# Patient Record
Sex: Female | Born: 2000 | Race: Black or African American | Hispanic: No | Smoking: Never smoker
Health system: Southern US, Community
[De-identification: ages and names within clinical notes are randomized; demographics above are authoritative.]

## PROBLEM LIST (undated history)

## (undated) DIAGNOSIS — M419 Scoliosis, unspecified: Secondary | ICD-10-CM

## (undated) DIAGNOSIS — N390 Urinary tract infection, site not specified: Secondary | ICD-10-CM

## (undated) DIAGNOSIS — F419 Anxiety disorder, unspecified: Secondary | ICD-10-CM

## (undated) DIAGNOSIS — F32A Depression, unspecified: Secondary | ICD-10-CM

## (undated) DIAGNOSIS — J45909 Unspecified asthma, uncomplicated: Secondary | ICD-10-CM

## (undated) HISTORY — PX: NO PAST SURGERIES: SHX2092

---
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
## 2020-02-02 ENCOUNTER — Ambulatory Visit: Payer: Medicaid Other | Admitting: Student

## 2020-08-23 DIAGNOSIS — E282 Polycystic ovarian syndrome: Secondary | ICD-10-CM | POA: Insufficient documentation

## 2021-02-24 ENCOUNTER — Encounter (HOSPITAL_BASED_OUTPATIENT_CLINIC_OR_DEPARTMENT_OTHER): Payer: Self-pay | Admitting: Emergency Medicine

## 2021-02-24 ENCOUNTER — Other Ambulatory Visit: Payer: Self-pay

## 2021-02-24 ENCOUNTER — Emergency Department (HOSPITAL_BASED_OUTPATIENT_CLINIC_OR_DEPARTMENT_OTHER)
Admission: EM | Admit: 2021-02-24 | Discharge: 2021-02-24 | Discharge status: Against Medical Advice | Payer: Medicaid Other | Attending: Physician Assistant | Admitting: Physician Assistant

## 2021-02-24 DIAGNOSIS — R509 Fever, unspecified: Secondary | ICD-10-CM | POA: Diagnosis not present

## 2021-02-24 DIAGNOSIS — Z20822 Contact with and (suspected) exposure to covid-19: Secondary | ICD-10-CM | POA: Diagnosis not present

## 2021-02-24 DIAGNOSIS — M7918 Myalgia, other site: Secondary | ICD-10-CM | POA: Insufficient documentation

## 2021-02-24 DIAGNOSIS — J101 Influenza due to other identified influenza virus with other respiratory manifestations: Secondary | ICD-10-CM | POA: Insufficient documentation

## 2021-02-24 DIAGNOSIS — R059 Cough, unspecified: Secondary | ICD-10-CM | POA: Insufficient documentation

## 2021-02-24 DIAGNOSIS — Z5321 Procedure and treatment not carried out due to patient leaving prior to being seen by health care provider: Secondary | ICD-10-CM | POA: Diagnosis not present

## 2021-02-24 LAB — RESP PANEL BY RT-PCR (FLU A&B, COVID) ARPGX2
Influenza A by PCR: POSITIVE — AB
Influenza B by PCR: NEGATIVE
SARS Coronavirus 2 by RT PCR: NEGATIVE

## 2021-02-24 MED ORDER — ACETAMINOPHEN 325 MG PO TABS: 650.0000 mg | ORAL_TABLET | Freq: Once | ORAL | Status: AC

## 2021-02-24 MED ORDER — ACETAMINOPHEN 325 MG PO TABS
ORAL_TABLET | ORAL | Status: AC
  Administered 2021-02-24: 650 mg via ORAL
  Filled 2021-02-24: qty 1

## 2021-02-24 NOTE — ED Triage Notes (Signed)
Pt presents to ED POV. Pt c/o fever, generalized body aches, and cough since Monday.

## 2021-03-31 ENCOUNTER — Telehealth (HOSPITAL_BASED_OUTPATIENT_CLINIC_OR_DEPARTMENT_OTHER): Payer: Self-pay | Admitting: Medical

## 2021-03-31 NOTE — Telephone Encounter (Signed)
Called patient and left message to call the office to setup her gyn visit with Raynelle Fanning.

## 2021-05-29 ENCOUNTER — Encounter (HOSPITAL_BASED_OUTPATIENT_CLINIC_OR_DEPARTMENT_OTHER): Payer: Self-pay | Admitting: Emergency Medicine

## 2021-05-29 ENCOUNTER — Other Ambulatory Visit: Payer: Self-pay

## 2021-05-29 ENCOUNTER — Emergency Department (HOSPITAL_BASED_OUTPATIENT_CLINIC_OR_DEPARTMENT_OTHER)
Admission: EM | Admit: 2021-05-29 | Discharge: 2021-05-29 | Disposition: A | Discharge status: Home / Self Care | Payer: Medicaid Other | Attending: Emergency Medicine | Admitting: Emergency Medicine

## 2021-05-29 ENCOUNTER — Other Ambulatory Visit (HOSPITAL_BASED_OUTPATIENT_CLINIC_OR_DEPARTMENT_OTHER): Payer: Self-pay

## 2021-05-29 DIAGNOSIS — J029 Acute pharyngitis, unspecified: Secondary | ICD-10-CM | POA: Diagnosis present

## 2021-05-29 DIAGNOSIS — J020 Streptococcal pharyngitis: Secondary | ICD-10-CM | POA: Insufficient documentation

## 2021-05-29 DIAGNOSIS — Z20822 Contact with and (suspected) exposure to covid-19: Secondary | ICD-10-CM | POA: Insufficient documentation

## 2021-05-29 LAB — RESP PANEL BY RT-PCR (FLU A&B, COVID) ARPGX2
Influenza A by PCR: NEGATIVE
Influenza B by PCR: NEGATIVE
SARS Coronavirus 2 by RT PCR: NEGATIVE

## 2021-05-29 LAB — PREGNANCY, URINE: Preg Test, Ur: NEGATIVE

## 2021-05-29 LAB — GROUP A STREP BY PCR: Group A Strep by PCR: DETECTED — AB

## 2021-05-29 MED ORDER — LIDOCAINE VISCOUS HCL 2 % MT SOLN
15.0000 mL | Freq: Three times a day (TID) | OROMUCOSAL | 0 refills | Status: DC | PRN
  Filled 2021-05-29: qty 150, 4d supply, fill #0

## 2021-05-29 MED ORDER — LIDOCAINE VISCOUS HCL 2 % MT SOLN
15.0000 mL | Freq: Once | OROMUCOSAL | Status: AC
  Administered 2021-05-29: 15 mL via OROMUCOSAL
  Filled 2021-05-29: qty 15

## 2021-05-29 MED ORDER — NAPROXEN 500 MG PO TABS
500.0000 mg | ORAL_TABLET | Freq: Two times a day (BID) | ORAL | 0 refills | Status: DC
  Filled 2021-05-29: qty 30, 15d supply, fill #0

## 2021-05-29 MED ORDER — AMOXICILLIN 500 MG PO CAPS
1000.0000 mg | ORAL_CAPSULE | Freq: Once | ORAL | Status: AC
  Administered 2021-05-29: 1000 mg via ORAL
  Filled 2021-05-29: qty 2

## 2021-05-29 MED ORDER — DEXAMETHASONE 4 MG PO TABS
6.0000 mg | ORAL_TABLET | Freq: Once | ORAL | Status: AC
  Administered 2021-05-29: 6 mg via ORAL
  Filled 2021-05-29: qty 2

## 2021-05-29 MED ORDER — KETOROLAC TROMETHAMINE 30 MG/ML IJ SOLN
30.0000 mg | Freq: Once | INTRAMUSCULAR | Status: AC
  Administered 2021-05-29: 30 mg via INTRAMUSCULAR
  Filled 2021-05-29: qty 1

## 2021-05-29 MED ORDER — AMOXICILLIN 500 MG PO CAPS
1000.0000 mg | ORAL_CAPSULE | Freq: Every day | ORAL | 0 refills | Status: AC
  Filled 2021-05-29: qty 18, 9d supply, fill #0

## 2021-05-29 NOTE — ED Provider Notes (Signed)
[facility] EMERGENCY DEPT Provider Note   CSN: [id number] Arrival date & time: 05/29/21  1243     History  Chief Complaint  Patient presents with   Sore Throat    Lauren Wilkinson is a 21 y.o. female with no significant past medical history here for evaluation of sore throat.  Began yesterday.  Felt warm this morning, thought she had a fever or did not take her temperature.  Has been taking Tylenol and ibuprofen without relief.  Pain hurts worse with swallowing.  Has had some generalized myalgias.  No headache, neck pain, change in voice, chest pain, shortness of breath, congestion, rhinorrhea, cough abdominal pain.  No known COVID exposures.  No diarrhea, dysuria.  Denies chance of pregnancy.  HPI     Home Medications Prior to Admission medications   Medication Sig Start Date End Date Taking? Authorizing Provider  amoxicillin (AMOXIL) 500 MG capsule Take 2 capsules (1,000 mg total) by mouth daily for 9 days. 05/29/21 06/07/21 Yes Monette Omara A, PA-C  lidocaine (XYLOCAINE) 2 % solution Use as directed 15 mLs in the mouth or throat every 8 (eight) hours as needed for mouth pain. 05/29/21  Yes Treniece Holsclaw A, PA-C  naproxen (NAPROSYN) 500 MG tablet Take 1 tablet (500 mg total) by mouth 2 (two) times daily. 05/29/21  Yes Hazeline Charnley A, PA-C  Cetirizine HCl (ZYRTEC PO) Take by mouth.    [provider]  etonogestrel-ethinyl estradiol (NUVARING) 0.12-0.015 MG/24HR vaginal ring Insert vaginally and leave in place for 3 consecutive weeks, then remove for 1 week. 03/18/19   Luvenia Redden, PA-C  FLUoxetine (PROZAC) 20 MG capsule Take 1 capsule (20 mg total) by mouth daily. 10/02/19 12/31/19  Deloria Lair, NP  fluticasone (FLONASE) 50 MCG/ACT nasal spray Place 2 sprays into both nostrils daily. 12/24/16   Waynetta Pean, PA-C  ibuprofen (ADVIL,MOTRIN) 400 MG tablet Take 1 tablet (400 mg total) by mouth every 6 (six) hours as needed for moderate pain. 12/24/16    Waynetta Pean, PA-C  Montelukast Sodium (SINGULAIR PO) Take by mouth.    [provider]  norelgestromin-ethinyl estradiol (ORTHO EVRA) 150-35 MCG/24HR transdermal patch Place 1 patch onto the skin once a week. Patient not taking: Reported on 03/18/2019 02/02/19   Nugent, Gerrie Nordmann, NP  SYEDA 3-0.03 MG tablet TAKE 1 TABLET BY MOUTH DAILY Patient not taking: Reported on 03/18/2019 06/18/18   Glenice Bow, DO      Allergies    Patient has no known allergies.    Review of Systems   Review of Systems  Constitutional:  Positive for chills, fatigue and fever.  HENT:  Positive for sore throat. Negative for congestion, drooling, postnasal drip, rhinorrhea, sinus pressure, sinus pain, sneezing, trouble swallowing and voice change.   Respiratory: Negative.    Cardiovascular: Negative.   Gastrointestinal: Negative.   Genitourinary: Negative.   Musculoskeletal: Negative.   Skin: Negative.   Neurological: Negative.   All other systems reviewed and are negative.  Physical Exam Updated Vital Signs BP 115/78 (BP Location: Right Arm)    Pulse (!) 116    Temp 98.6 F (37 C) (Oral)    Resp 18    Ht 5\' 4"  (1.626 m)    Wt 81.6 kg    SpO2 100%    BMI 30.90 kg/m  Physical Exam Vitals and nursing note reviewed.  Constitutional:      General: She is not in acute distress.    Appearance: She is well-developed. She  is not ill-appearing, toxic-appearing or diaphoretic.  HENT:     Head: Normocephalic and atraumatic.     Jaw: There is normal jaw occlusion.     Comments: No facial swelling.  No drooling, dysphagia or trismus    Nose: Nose normal. No congestion or rhinorrhea.     Right Sinus: No maxillary sinus tenderness or frontal sinus tenderness.     Left Sinus: No maxillary sinus tenderness or frontal sinus tenderness.     Mouth/Throat:     Lips: Pink.     Mouth: Mucous membranes are moist.     Palate: No mass and lesions.     Pharynx: Uvula midline. Posterior oropharyngeal erythema  present. No oropharyngeal exudate or uvula swelling.     Tonsils: Tonsillar exudate present. No tonsillar abscesses. 3+ on the right. 3+ on the left.     Comments: Posterior oropharynx erythematous.  Tonsils 3+ bilaterally with erythema and exudate.  Uvula midline.  No drooling, sublingual area soft.  No pooling of secretions.  No evidence of PTA or RPA Eyes:     Pupils: Pupils are equal, round, and reactive to light.  Neck:     Trachea: Trachea and phonation normal.     Comments: No neck stiffness or neck rigidity Cardiovascular:     Rate and Rhythm: Normal rate.  Pulmonary:     Effort: No respiratory distress.  Abdominal:     General: There is no distension.  Musculoskeletal:        General: Normal range of motion.     Cervical back: Full passive range of motion without pain and normal range of motion.  Skin:    General: Skin is warm and dry.  Neurological:     General: No focal deficit present.     Mental Status: She is alert.  Psychiatric:        Mood and Affect: Mood normal.    ED Results / Procedures / Treatments   Labs (all labs ordered are listed, but only abnormal results are displayed) Labs Reviewed  GROUP A STREP BY PCR - Abnormal; Notable for the following components:      Result Value   Group A Strep by PCR DETECTED (*)    All other components within normal limits  RESP PANEL BY RT-PCR (FLU A&B, COVID) ARPGX2  PREGNANCY, URINE    EKG None  Radiology No results found.  Procedures Procedures    Medications Ordered in ED Medications  ketorolac (TORADOL) 30 MG/ML injection 30 mg (30 mg Intramuscular Given 05/29/21 1334)  dexamethasone (DECADRON) tablet 6 mg (6 mg Oral Given 05/29/21 1337)  lidocaine (XYLOCAINE) 2 % viscous mouth solution 15 mL (15 mLs Mouth/Throat Given 05/29/21 1338)  amoxicillin (AMOXIL) capsule 1,000 mg (1,000 mg Oral Given 05/29/21 1356)    ED Course/ Medical Decision Making/ A&P    21 year old here for evaluation of sore throat and  subjective fever which began yesterday.  Associated myalgias.  She is afebrile, nonseptic, not ill-appearing.  Her heart and lungs are clear.  Abdomen is soft, nontender.  She has no neck stiffness or neck rigidity.  She has no meningismus.  Does have posterior oropharyngeal erythema.  Tonsils 2/3+ bilaterally with erythema and exudate.  Do not touch at midline.  Uvula is midline without any deviation.  She has no evidence of PTA or RPA.  She has no change in voice.  She is tolerating her secretions as well as p.o. intake.  No pooling of secretions.  Labs personally viewed  and interpreted: Preg neg Strep positive, given amox COVID/Flu neg  Patient reassessed. Given abx, pain control here in ED.  She appears otherwise well, tolerating p.o. intake.  Discussed laboratory findings.  DC home with Amoxacillin.  She will return for new or worsening symptoms.  The patient has been appropriately medically screened and/or stabilized in the ED. I have low suspicion for any other emergent medical condition which would require further screening, evaluation or treatment in the ED or require inpatient management.  Patient is hemodynamically stable and in no acute distress.  Patient able to ambulate in department prior to ED.  Evaluation does not show acute pathology that would require ongoing or additional emergent interventions while in the emergency department or further inpatient treatment.  I have discussed the diagnosis with the patient and answered all questions.  Pain is been managed while in the emergency department and patient has no further complaints prior to discharge.  Patient is comfortable with plan discussed in room and is stable for discharge at this time.  I have discussed strict return precautions for returning to the emergency department.  Patient was encouraged to follow-up with PCP/specialist refer to at discharge.                            Medical Decision Making Amount and/or Complexity of  Data Reviewed External Data Reviewed: labs and notes. Labs: ordered. Decision-making details documented in ED Course.  Risk OTC drugs. Prescription drug management. Risk Details: Do not feel patient needs additional labs, imaging, hospitalization at this time          Final Clinical Impression(s) / ED Diagnoses Final diagnoses:  Strep pharyngitis    Rx / DC Orders ED Discharge Orders          Ordered    lidocaine (XYLOCAINE) 2 % solution  Every 8 hours PRN        05/29/21 1419    amoxicillin (AMOXIL) 500 MG capsule  Daily        05/29/21 1419    naproxen (NAPROSYN) 500 MG tablet  2 times daily        05/29/21 1419              Lorah Kalina A, PA-C 05/29/21 [place], [place], DO 05/30/21 [phone]

## 2021-05-29 NOTE — Discharge Instructions (Signed)
Your testing today showed that you have strep pharyngitis which is likely the cause of your sore throat.  We have written you for antibiotics.  We have also written you for something to help with your pain.  You may take additional Tylenol with this.  You need to be out of work for 24 hours without fever without using Tylenol or ibuprofen.

## 2021-05-29 NOTE — ED Triage Notes (Signed)
Pt arrives to ED with c/o sore throat, body aches. This started last night. This morning she woke up with hot/warm.

## 2021-07-09 ENCOUNTER — Encounter (HOSPITAL_BASED_OUTPATIENT_CLINIC_OR_DEPARTMENT_OTHER): Payer: Self-pay

## 2021-07-09 ENCOUNTER — Emergency Department (HOSPITAL_BASED_OUTPATIENT_CLINIC_OR_DEPARTMENT_OTHER)
Admission: EM | Admit: 2021-07-09 | Discharge: 2021-07-09 | Disposition: A | Discharge status: Home / Self Care | Payer: Medicaid Other | Attending: Emergency Medicine | Admitting: Emergency Medicine

## 2021-07-09 ENCOUNTER — Other Ambulatory Visit: Payer: Self-pay

## 2021-07-09 DIAGNOSIS — Z7951 Long term (current) use of inhaled steroids: Secondary | ICD-10-CM | POA: Diagnosis not present

## 2021-07-09 DIAGNOSIS — J45909 Unspecified asthma, uncomplicated: Secondary | ICD-10-CM | POA: Diagnosis not present

## 2021-07-09 DIAGNOSIS — H1032 Unspecified acute conjunctivitis, left eye: Secondary | ICD-10-CM

## 2021-07-09 DIAGNOSIS — H1089 Other conjunctivitis: Secondary | ICD-10-CM | POA: Insufficient documentation

## 2021-07-09 DIAGNOSIS — H109 Unspecified conjunctivitis: Secondary | ICD-10-CM | POA: Diagnosis present

## 2021-07-09 MED ORDER — CIPROFLOXACIN HCL 0.3 % OP SOLN: 2.0000 [drp] | OPHTHALMIC | 0 refills | Status: DC

## 2021-07-09 NOTE — ED Triage Notes (Signed)
Left eye, red & itchy started yesterday ?

## 2021-07-09 NOTE — ED Provider Notes (Signed)
?[facility] EMERGENCY DEPT ?Provider Note ? ? ?CSN: [id number] ?Arrival date & time: 07/09/21  1950 ? ?  ? ?History ? ?Chief Complaint  ?Patient presents with  ? Conjunctivitis  ? ? ?Lauren Wilkinson is a 21 y.o. female with medical history of asthma and scoliosis.  Patient presents ED for evaluation of left-sided eye swelling and redness.  Patient states that 4 days ago her left eye began swelling and she noticed that she had crusting and discharge from it in the morning.  Patient reports that her brother recently had eye infection and she is concerned about this.  Patient states that over the last 4 days the symptoms of progressively worsened.  She has not tried any OTC medication.  Patient endorsing red eye, itching to the eye, discharge from eye.  Patient denies any fevers, nausea, vomiting, body aches or chills, sore throat, abdominal pain, diarrhea, visual disturbance.  Patient does wear contact lenses. ? ? ?Conjunctivitis ?Pertinent negatives include no abdominal pain.  ? ?  ? ?Home Medications ?Prior to Admission medications   ?Medication Sig Start Date End Date Taking? Authorizing Provider  ?ciprofloxacin (CILOXAN) 0.3 % ophthalmic solution Place 2 drops into the left eye every 4 (four) hours while awake. Administer 1 drop, every 2 hours, while awake, for 2 days. Then 1 drop, every 4 hours, while awake, for the next 5 days. 07/09/21  Yes Al Decant, PA-C  ?Cetirizine HCl (ZYRTEC PO) Take by mouth.    [provider]  ?etonogestrel-ethinyl estradiol (NUVARING) 0.12-0.015 MG/24HR vaginal ring Insert vaginally and leave in place for 3 consecutive weeks, then remove for 1 week. 03/18/19   Marny Lowenstein, PA-C  ?FLUoxetine (PROZAC) 20 MG capsule Take 1 capsule (20 mg total) by mouth daily. 10/02/19 12/31/19  Jearld Lesch, NP  ?fluticasone (FLONASE) 50 MCG/ACT nasal spray Place 2 sprays into both nostrils daily. 12/24/16   Everlene Farrier, PA-C  ?ibuprofen (ADVIL,MOTRIN) 400 MG tablet  Take 1 tablet (400 mg total) by mouth every 6 (six) hours as needed for moderate pain. 12/24/16   Everlene Farrier, PA-C  ?lidocaine (XYLOCAINE) 2 % solution Use as directed 15 mLs in the mouth or throat every 8 (eight) hours as needed for mouth pain. 05/29/21   Henderly, Britni A, PA-C  ?Montelukast Sodium (SINGULAIR PO) Take by mouth.    [provider]  ?naproxen (NAPROSYN) 500 MG tablet Take 1 tablet (500 mg total) by mouth 2 (two) times daily. 05/29/21   Henderly, Britni A, PA-C  ?norelgestromin-ethinyl estradiol (ORTHO EVRA) 150-35 MCG/24HR transdermal patch Place 1 patch onto the skin once a week. ?Patient not taking: Reported on 03/18/2019 02/02/19   Nugent, Odie Sera, NP  ?SYEDA 3-0.03 MG tablet TAKE 1 TABLET BY MOUTH DAILY ?Patient not taking: Reported on 03/18/2019 06/18/18   Tamera Stands, DO  ?   ? ?Allergies    ?Patient has no known allergies.   ? ?Review of Systems   ?Review of Systems  ?Constitutional:  Negative for chills and fever.  ?Eyes:  Positive for discharge, redness and itching. Negative for visual disturbance.  ?Gastrointestinal:  Negative for abdominal pain, diarrhea, nausea and vomiting.  ?All other systems reviewed and are negative. ? ?Physical Exam ?Updated Vital Signs ?BP (!) 151/90 (BP Location: Right Arm)   Pulse (!) 110   Temp 98.3 ?F (36.8 ?C) (Oral)   Resp 18   Ht 5\' 5"  (1.651 m)   Wt 79.8 kg   LMP 07/06/2021 (Exact Date)  SpO2 100%   BMI 29.29 kg/m?  ?Physical Exam ?Vitals and nursing note reviewed.  ?Constitutional:   ?   General: She is not in acute distress. ?   Appearance: Normal appearance. She is not ill-appearing, toxic-appearing or diaphoretic.  ?HENT:  ?   Head: Normocephalic and atraumatic.  ?   Nose: Nose normal. No congestion.  ?Eyes:  ?   General: Lids are everted, no foreign bodies appreciated. Vision grossly intact. Gaze aligned appropriately. No visual field deficit.    ?   Left eye: Discharge present.No foreign body.  ?   Extraocular Movements:  Extraocular movements intact.  ?   Conjunctiva/sclera:  ?   Left eye: Left conjunctiva is injected.  ?   Pupils: Pupils are equal, round, and reactive to light.  ?Cardiovascular:  ?   Rate and Rhythm: Normal rate and regular rhythm.  ?Pulmonary:  ?   Effort: Pulmonary effort is normal.  ?   Breath sounds: Normal breath sounds. No wheezing.  ?Abdominal:  ?   General: Abdomen is flat. Bowel sounds are normal.  ?   Palpations: Abdomen is soft.  ?   Tenderness: There is no abdominal tenderness.  ?Musculoskeletal:  ?   Cervical back: Normal range of motion and neck supple. No tenderness.  ?Skin: ?   General: Skin is warm.  ?   Capillary Refill: Capillary refill takes less than 2 seconds.  ?Neurological:  ?   Mental Status: She is alert and oriented to person, place, and time.  ? ? ?ED Results / Procedures / Treatments   ?Labs ?(all labs ordered are listed, but only abnormal results are displayed) ?Labs Reviewed - No data to display ? ?EKG ?None ? ?Radiology ?No results found. ? ?Procedures ?Procedures  ? ?Medications Ordered in ED ?Medications - No data to display ? ?ED Course/ Medical Decision Making/ A&P ?  ?                        ?Medical Decision Making ? ?21 year old female, contact lens wearer, presents ED for evaluation of left eye swelling and redness.  Please see HPI for further details. ? ?On exam, patient afebrile, nontachycardic, nonhypoxic.  Patient lung sounds clear bilaterally.  Patient left eye is injected with scant discharge and canthus.  Patient visual acuity intact.  Patient denies any visual disturbances.  There is no soft tissue swelling around the eye to suggest periorbital cellulitis.  Patient does not endorse any pain with assessment of EOMs.  Orbital cellulitis suspicion is low at this time.  Patient states that she does have positive sick contact, her brother recently had conjunctivitis. ? ?At this time, patient stable for discharge.  Provided patient with antibiotic eyedrops to cover  Pseudomonas due to her contact lens usage.  Patient provided with return precautions and she voiced understanding.  Patient had all of her questions answered to her satisfaction.  Patient stable for discharge at this time. ? ? ?Final Clinical Impression(s) / ED Diagnoses ?Final diagnoses:  ?Acute bacterial conjunctivitis of left eye  ? ? ?Rx / DC Orders ?ED Discharge Orders   ? ?      Ordered  ?  ciprofloxacin (CILOXAN) 0.3 % ophthalmic solution  Every 4 hours while awake       ? 07/09/21 2108  ? ?  ?  ? ?  ? ? ?  ?Azucena Cecil, PA-C ?07/09/21 2109 ? ?  ?Malvin Johns, MD ?07/09/21 2312 ? ?

## 2021-07-09 NOTE — Discharge Instructions (Signed)
Return to ED with any new symptoms such as worsened eye redness, itching, fevers, nausea or vomiting, visual disturbances ?Please pick up your antibiotic eyedrops I sent in ?Please do not wear contact lenses until the infection clears ?You may also use warm compresses to the eye to alleviate any discomfort ?Follow back up with your PCP for any ongoing needs ?

## 2021-09-13 ENCOUNTER — Encounter (HOSPITAL_BASED_OUTPATIENT_CLINIC_OR_DEPARTMENT_OTHER): Payer: Self-pay | Admitting: Emergency Medicine

## 2021-09-13 ENCOUNTER — Emergency Department (HOSPITAL_BASED_OUTPATIENT_CLINIC_OR_DEPARTMENT_OTHER): Payer: Medicaid Other

## 2021-09-13 ENCOUNTER — Other Ambulatory Visit: Payer: Self-pay

## 2021-09-13 ENCOUNTER — Inpatient Hospital Stay (HOSPITAL_BASED_OUTPATIENT_CLINIC_OR_DEPARTMENT_OTHER)
Admission: EM | Admit: 2021-09-13 | Discharge: 2021-09-14 | Disposition: A | Discharge status: Home / Self Care | Payer: Medicaid Other | Attending: Obstetrics & Gynecology | Admitting: Obstetrics & Gynecology

## 2021-09-13 DIAGNOSIS — Z6790 Unspecified blood type, Rh positive: Secondary | ICD-10-CM

## 2021-09-13 DIAGNOSIS — Z79899 Other long term (current) drug therapy: Secondary | ICD-10-CM | POA: Insufficient documentation

## 2021-09-13 DIAGNOSIS — O00102 Left tubal pregnancy without intrauterine pregnancy: Secondary | ICD-10-CM | POA: Diagnosis present

## 2021-09-13 LAB — COMPREHENSIVE METABOLIC PANEL
ALT: 10 U/L (ref 0–44)
AST: 16 U/L (ref 15–41)
Albumin: 4.2 g/dL (ref 3.5–5.0)
Alkaline Phosphatase: 59 U/L (ref 38–126)
Anion gap: 12 (ref 5–15)
BUN: 8 mg/dL (ref 6–20)
CO2: 22 mmol/L (ref 22–32)
Calcium: 9.2 mg/dL (ref 8.9–10.3)
Chloride: 106 mmol/L (ref 98–111)
Creatinine, Ser: 0.73 mg/dL (ref 0.44–1.00)
GFR, Estimated: >60 mL/min (ref >60)
Glucose, Bld: 88 mg/dL (ref 70–99)
Potassium: 3.8 mmol/L (ref 3.5–5.1)
Sodium: 140 mmol/L (ref 135–145)
Total Bilirubin: 0.3 mg/dL (ref 0.3–1.2)
Total Protein: 7.5 g/dL (ref 6.5–8.1)

## 2021-09-13 LAB — URINALYSIS, ROUTINE W REFLEX MICROSCOPIC
Bilirubin Urine: NEGATIVE
Glucose, UA: NEGATIVE mg/dL
Ketones, ur: NEGATIVE mg/dL
Leukocytes,Ua: NEGATIVE
Nitrite: NEGATIVE
Protein, ur: NEGATIVE mg/dL
Specific Gravity, Urine: 1.015 (ref 1.005–1.030)
pH: 7 (ref 5.0–8.0)

## 2021-09-13 LAB — CBC WITH DIFFERENTIAL/PLATELET
Abs Immature Granulocytes: 0.02 10*3/uL (ref 0.00–0.07)
Basophils Absolute: 0 10*3/uL (ref 0.0–0.1)
Basophils Relative: 0 %
Eosinophils Absolute: 0.1 10*3/uL (ref 0.0–0.5)
Eosinophils Relative: 2 %
HCT: 36.6 % (ref 36.0–46.0)
Hemoglobin: 11.9 g/dL — ABNORMAL LOW (ref 12.0–15.0)
Immature Granulocytes: 0 %
Lymphocytes Relative: 43 %
Lymphs Abs: 3.7 10*3/uL (ref 0.7–4.0)
MCH: 29.1 pg (ref 26.0–34.0)
MCHC: 32.5 g/dL (ref 30.0–36.0)
MCV: 89.5 fL (ref 80.0–100.0)
Monocytes Absolute: 0.7 10*3/uL (ref 0.1–1.0)
Monocytes Relative: 8 %
Neutro Abs: 4.1 10*3/uL (ref 1.7–7.7)
Neutrophils Relative %: 47 %
Platelets: 373 10*3/uL (ref 150–400)
RBC: 4.09 MIL/uL (ref 3.87–5.11)
RDW: 13.8 % (ref 11.5–15.5)
WBC: 8.7 10*3/uL (ref 4.0–10.5)
nRBC: 0 % (ref 0.0–0.2)

## 2021-09-13 LAB — PREGNANCY, URINE: Preg Test, Ur: POSITIVE — AB

## 2021-09-13 LAB — HCG, QUANTITATIVE, PREGNANCY: hCG, Beta Chain, Quant, S: 1130 m[IU]/mL — ABNORMAL HIGH (ref <5)

## 2021-09-13 NOTE — ED Triage Notes (Signed)
Patient reports to the ER for vaginal bleeding. She reports she wants an [place] as she had a positive pregnancy test and wants to know how far along she is. Denies abdominal pain. Endorses 1 episode of emesis.

## 2021-09-13 NOTE — ED Provider Notes (Signed)
[facility]   CSN: [id number] Arrival date & time: 09/13/21  1910     History  Chief Complaint  Patient presents with   Vaginal Bleeding    Lauren Wilkinson is a 21 y.o. female with chief complaint of vaginal bleeding since this morning.  Positive pregnancy test and positive for chlamydia on 08/28/2021.  No known history of pregnancy.  LMP believed to be 08/06/2021.  Currently being treated for the chlamydia.  Denies significant abdominal pain.  Concern for the "blood clots" that she passed earlier today.  Denies fever, dysuria or urinary symptoms, abdominal pain, stiff neck, lightheadedness or dizziness, or shortness of breath.  Unknown blood type.  Denies recent trauma.  Hx of GAD, asthma, PCOS, panic anxiety syndrome.  The history is provided by the patient and medical records.  Vaginal Bleeding     Home Medications Prior to Admission medications   Medication Sig Start Date End Date Taking? Authorizing Provider  Cetirizine HCl (ZYRTEC PO) Take by mouth.    [provider]  ciprofloxacin (CILOXAN) 0.3 % ophthalmic solution Place 2 drops into the left eye every 4 (four) hours while awake. Administer 1 drop, every 2 hours, while awake, for 2 days. Then 1 drop, every 4 hours, while awake, for the next 5 days. 07/09/21   Al DecantGroce, Christopher F, PA-C  etonogestrel-ethinyl estradiol (NUVARING) 0.12-0.015 MG/24HR vaginal ring Insert vaginally and leave in place for 3 consecutive weeks, then remove for 1 week. 03/18/19   Marny LowensteinWenzel, Julie N, PA-C  FLUoxetine (PROZAC) 20 MG capsule Take 1 capsule (20 mg total) by mouth daily. 10/02/19 12/31/19  Jearld Leschixon, Rashaun M, NP  fluticasone (FLONASE) 50 MCG/ACT nasal spray Place 2 sprays into both nostrils daily. 12/24/16   Everlene Farrieransie, William, PA-C  ibuprofen (ADVIL,MOTRIN) 400 MG tablet Take 1 tablet (400 mg total) by mouth every 6 (six) hours as needed for moderate pain. 12/24/16   Everlene Farrieransie, William, PA-C  lidocaine  (XYLOCAINE) 2 % solution Use as directed 15 mLs in the mouth or throat every 8 (eight) hours as needed for mouth pain. 05/29/21   Henderly, Britni A, PA-C  Montelukast Sodium (SINGULAIR PO) Take by mouth.    [provider]  naproxen (NAPROSYN) 500 MG tablet Take 1 tablet (500 mg total) by mouth 2 (two) times daily. 05/29/21   Henderly, Britni A, PA-C  norelgestromin-ethinyl estradiol (ORTHO EVRA) 150-35 MCG/24HR transdermal patch Place 1 patch onto the skin once a week. Patient not taking: Reported on 03/18/2019 02/02/19   Nugent, Odie SeraNicole E, NP  SYEDA 3-0.03 MG tablet TAKE 1 TABLET BY MOUTH DAILY Patient not taking: Reported on 03/18/2019 06/18/18   Tamera StandsWallace, Laurel S, DO      Allergies    Patient has no known allergies.    Review of Systems   Review of Systems  Genitourinary:  Positive for vaginal bleeding.   Physical Exam Updated Vital Signs BP 131/80 (BP Location: Right Arm)   Pulse 95   Temp 99.1 F (37.3 C) (Oral)   Resp 16   Ht 5' 4.5" (1.638 m)   Wt 81.6 kg   LMP 08/06/2021 (Approximate)   SpO2 100%   BMI 30.42 kg/m  Physical Exam Vitals and nursing note reviewed.  Constitutional:      General: She is not in acute distress.    Appearance: Normal appearance. She is well-developed. She is not ill-appearing or diaphoretic.  HENT:     Head: Normocephalic and atraumatic.     Mouth/Throat:  Mouth: Mucous membranes are moist.     Pharynx: Oropharynx is clear.  Eyes:     General: No scleral icterus.    Conjunctiva/sclera: Conjunctivae normal.  Cardiovascular:     Rate and Rhythm: Normal rate and regular rhythm.     Heart sounds: No murmur heard. Pulmonary:     Effort: Pulmonary effort is normal. No respiratory distress.     Breath sounds: Normal breath sounds.  Chest:     Chest wall: No tenderness.  Abdominal:     General: Bowel sounds are normal.     Palpations: Abdomen is soft.     Tenderness: There is no abdominal tenderness.  Musculoskeletal:         General: No swelling.     Cervical back: Neck supple. No rigidity or tenderness.     Right lower leg: No edema.     Left lower leg: No edema.  Skin:    General: Skin is warm and dry.     Capillary Refill: Capillary refill takes less than 2 seconds.     Coloration: Skin is not jaundiced or pale.  Neurological:     Mental Status: She is alert and oriented to person, place, and time.  Psychiatric:        Mood and Affect: Mood normal.    ED Results / Procedures / Treatments   Labs (all labs ordered are listed, but only abnormal results are displayed) Labs Reviewed  PREGNANCY, URINE - Abnormal; Notable for the following components:      Result Value   Preg Test, Ur POSITIVE (*)    All other components within normal limits  URINALYSIS, ROUTINE W REFLEX MICROSCOPIC - Abnormal; Notable for the following components:   Hgb urine dipstick LARGE (*)    All other components within normal limits  HCG, QUANTITATIVE, PREGNANCY - Abnormal; Notable for the following components:   hCG, Beta Chain, Quant, S 1,130 (*)    All other components within normal limits  CBC WITH DIFFERENTIAL/PLATELET - Abnormal; Notable for the following components:   Hemoglobin 11.9 (*)    All other components within normal limits  COMPREHENSIVE METABOLIC PANEL  ABO/RH    EKG None  Radiology US OB LESS THAN 14 WEEKS WITH OB TRANSVAGINAL  Addendum Date: 09/13/2021   ADDENDUM REPORT: 09/13/2021 22:39 ADDENDUM: These results were called by telephone at the time of interpretation on 09/13/2021 at 10:39 pm to provider [facility] , who verbally acknowledged these results. Electronically Signed   By: Tish Frederickson M.D.   On: 09/13/2021 22:39   Result Date: 09/13/2021 CLINICAL DATA:  Vaginal bleeding. Quantitative beta HCG 1,130. Positive chlamydia. Last menstrual period 08/06/2021. Gestational age by last menstrual of 5 weeks and 3 days. Estimated due date 05/13/2022. EXAM: OBSTETRIC <14 WK [place] AND TRANSVAGINAL OB US  TECHNIQUE: Both transabdominal and transvaginal ultrasound examinations were performed for complete evaluation of the gestation as well as the maternal uterus, adnexal regions, and pelvic cul-de-sac. Transvaginal technique was performed to assess early pregnancy. COMPARISON:  None Available. FINDINGS: Intrauterine gestational sac: None Yolk sac:  Not Visualized. Embryo:  Not Visualized. Cardiac Activity: Not Visualized. Maternal uterus/adnexae: There is a left adnexal mass measuring 0.8 x 0.8 x 0.7 cm with associated vascularity that moves independent from the left ovary when pressure is applied. No embryo noted within the lesion. The adjacent left ovary is unremarkable. The right ovary and adnexa are unremarkable. Other: No free fluid within the pelvis. IMPRESSION: 1. A subcentimeter left adnexal mass  is consistent with an unruptured tubal ectopic pregnancy. 2. No intrauterine pregnancy. Electronically Signed: By: Tish Frederickson M.D. On: 09/13/2021 22:29    Procedures Procedures    Medications Ordered in ED Medications - No data to display  ED Course/ Medical Decision Making/ A&P                           Medical Decision Making Amount and/or Complexity of Data Reviewed External Data Reviewed: notes. Labs: ordered. Decision-making details documented in ED Course. Radiology: ordered and independent interpretation performed. Decision-making details documented in ED Course. ECG/medicine tests: ordered and independent interpretation performed. Decision-making details documented in ED Course.  Risk OTC drugs. Prescription drug management. Decision regarding hospitalization.   21 y.o. female presents to the ED for concern of Vaginal Bleeding     This involves an extensive number of treatment options, and is a complaint that carries with it a high risk of complications and morbidity.    Past Medical History / Co-morbidities / Social History: Hx of asthma, panic anxiety syndrome, GAD,  recently diagnosed chlamydial STD infection Social Determinants of Health include Hx of risky sexual behavior, for counseling was provided  Additional History:  Internal and external records from outside source obtained and reviewed including primary care and urgent care notes  Physical Exam: Physical exam performed. The pertinent findings include: Very mild suprapubic tenderness without active vaginal hemorrhage.  Lab Tests: I ordered, and personally interpreted labs.  The pertinent results include:   CBC: Unremarkable CMP/BMP: Unremarkable Urine pregnancy: Positive hCG quantitative: 1,130 ABO/Rh: B+  Imaging Studies: I ordered imaging studies including US OB transvaginal.  I independently visualized and interpreted said imaging.  Pertinent results include: Left tubal ectopic pregnancy without rupture measuring 0.8 x 0.8 x 0.7 cm I agree with the radiologist interpretation.  Medications: None  ED Course/Disposition: Pt well-appearing on exam.  Complaining of unprovoked vaginal bleeding and a positive pregnancy test.  Very mild abdominal tenderness.  No known history of pregnancy.  Hx of multiple STDs and risky sexual behavior.  Currently being treated for chlamydial STD.  Ordered vaginal ultrasound to assess for ectopic pregnancy.  2035: Urine pregnancy positive.  hCG quantitative 1,130 and ABO Rh of B+, so I do not believe RhoGAM is appropriate at this time.  LMP 08/06/2021.  hCG value suggestive of 4-5 weeks of gestation, which correlates with LMP.  Urinalysis unremarkable with the exception of mild Hgb detected.  Likely related to vaginal bleeding.  2230: Vaginal ultrasound identifies unruptured ectopic pregnancy of the left fallopian tube.  Reading confirmed with radiologist.  Consultation placed for on-call OB/GYN.  2235: I consulted with Dr. Despina Hidden of OB/GYN, and discussed lab and imaging findings as well as pertinent plan - they recommend: [place] [facility] ED/MAU transfer for  continued treatment with methotrexate.  Requested order of CBC and CMP.  Patient is stable and in NAD.  Dr. Despina Hidden feels comfortable with the patient transferring via personal vehicle, preferably via close contact or family number.  MAU made aware of patient's expected arrival.  2245: I returned to the room to discuss results and proposed treatment plans with the patient.  Another person is present in the room, who the patient identified as "the father".  She provided verbal consent to discuss all relevant medical information and proposed treatments with this individual as well.  Discussed labs, imaging, and consultations.  Patient demonstrated understanding and was given opportunities to ask questions.  Questions answered at length and patient in agreement with proposed treatment course.  Patient feels comfortable being driven by this significant other to the MAU/ED at [facility] via personal vehicle as method of transfer.  Patient in NAD and condition at time of transfer.  I discussed this case with my attending, Dr. Deretha Emory, who agreed with the proposed treatment course and cosigned this note including patient's presenting symptoms, physical exam, and planned diagnostics and interventions.  Attending physician stated agreement with plan or made changes to plan which were implemented.     This chart was dictated using voice recognition software.  Despite best efforts to proofread, errors can occur which can change the documentation meaning.         Final Clinical Impression(s) / ED Diagnoses Final diagnoses:  Left tubal pregnancy without intrauterine pregnancy    Rx / DC Orders ED Discharge Orders     None         Sandrea Hammond 09/13/21 2336    Vanetta Mulders, MD 09/18/21 1101

## 2021-09-13 NOTE — ED Notes (Signed)
Pt sitting up in bed awake and alert- no acute distress.  RR even and unlabored on RA.  Pt reports vaginal bleeding began "beginning of this month" -- pt states it began similar to regular menstrual cycle and now only light vaginal bleeding.  Pt denies active pain and denies nausea.  Has changed into hospital gown and is awaiting provider (note- pt has been provided urine cup for urine specimen with next void- pt states unable to void at this time as she had just voided- pt also mentioned having recently been diagnosed with UTI but never completed round of ABX)

## 2021-09-13 NOTE — ED Notes (Signed)
Patient transported to Ultrasound via w/c at this time

## 2021-09-14 DIAGNOSIS — O00102 Left tubal pregnancy without intrauterine pregnancy: Secondary | ICD-10-CM

## 2021-09-14 LAB — ABO/RH: ABO/RH(D): B POS

## 2021-09-14 MED ORDER — METHOTREXATE FOR ECTOPIC PREGNANCY
50.0000 mg/m2 | Freq: Once | INTRAMUSCULAR | Status: AC
  Administered 2021-09-14: 97.5 mg via INTRAMUSCULAR
  Filled 2021-09-14: qty 10

## 2021-09-14 NOTE — MAU Note (Signed)
.  Lauren Wilkinson is a 21 y.o. at Unknown here in MAU reporting: transfer via [facility] from [facility] 09/07/2021. Pt reported she started VB about 7 days ago. Pt took a home pregnancy test about 3 days ago. Pt was feeling nauseous. Pt reports having light bleeding.   Onset of complaint: 7 days ago  Pain score: 0/10 Vitals:   09/13/21 2227 09/14/21 0043  BP: 131/80 135/85  Pulse: 95 100  Resp: 16 18  Temp:  98.9 F (37.2 C)  SpO2: 100% 100%      Lab orders placed from triage:

## 2021-09-14 NOTE — MAU Provider Note (Signed)
History     CSN: [id number]  Arrival date and time: 09/13/21 1910   None     Chief Complaint  Patient presents with   Vaginal Bleeding   Ms. Lauren Wilkinson is a 21 y.o. G1P0 at Unknown who presents to MAU for ectopic pregnancy treatment. Patient was seen and evaluated at [facility] ED for vaginal bleeding and patient was found to have an ectopic pregnancy and was sent to MAU for methotrexate. Patient denies vaginal bleeding at this time. Patient's boyfriend present for entire visit.  Pt denies VB, vaginal discharge/odor/itching. Pt denies N/V, abdominal pain, constipation, diarrhea, or urinary problems. Pt denies fever, chills, fatigue, sweating or changes in appetite. Pt denies SOB or chest pain. Pt denies dizziness, HA, light-headedness, weakness.  Allergies? NKDA Current medications/supplements? none    OB History     Gravida  1   Para      Term      Preterm      AB      Living         SAB      IAB      Ectopic      Multiple      Live Births              Past Medical History:  Diagnosis Date   Asthma    Scoliosis     History reviewed. No pertinent surgical history.  History reviewed. No pertinent family history.  Social History   Tobacco Use   Smoking status: Never   Smokeless tobacco: Never  Vaping Use   Vaping Use: Never used  Substance Use Topics   Alcohol use: No   Drug use: No    Allergies: No Known Allergies  Medications Prior to Admission  Medication Sig Dispense Refill Last Dose   Cetirizine HCl (ZYRTEC PO) Take by mouth.      ciprofloxacin (CILOXAN) 0.3 % ophthalmic solution Place 2 drops into the left eye every 4 (four) hours while awake. Administer 1 drop, every 2 hours, while awake, for 2 days. Then 1 drop, every 4 hours, while awake, for the next 5 days. 5 mL 0    etonogestrel-ethinyl estradiol (NUVARING) 0.12-0.015 MG/24HR vaginal ring Insert vaginally and leave in place for 3 consecutive weeks, then remove for 1  week. 1 each 12    FLUoxetine (PROZAC) 20 MG capsule Take 1 capsule (20 mg total) by mouth daily. 30 capsule 2    fluticasone (FLONASE) 50 MCG/ACT nasal spray Place 2 sprays into both nostrils daily. 16 g 0    ibuprofen (ADVIL,MOTRIN) 400 MG tablet Take 1 tablet (400 mg total) by mouth every 6 (six) hours as needed for moderate pain. 30 tablet 0    lidocaine (XYLOCAINE) 2 % solution Use as directed 15 mLs in the mouth or throat every 8 (eight) hours as needed for mouth pain. 150 mL 0    Montelukast Sodium (SINGULAIR PO) Take by mouth.      naproxen (NAPROSYN) 500 MG tablet Take 1 tablet (500 mg total) by mouth 2 (two) times daily. 30 tablet 0    norelgestromin-ethinyl estradiol (ORTHO EVRA) 150-35 MCG/24HR transdermal patch Place 1 patch onto the skin once a week. (Patient not taking: Reported on 03/18/2019) 3 patch 12    SYEDA 3-0.03 MG tablet TAKE 1 TABLET BY MOUTH DAILY (Patient not taking: Reported on 03/18/2019) 84 tablet 3     Review of Systems  Constitutional:  Negative for chills, diaphoresis, fatigue and fever.  Eyes:  Negative for visual disturbance.  Respiratory:  Negative for shortness of breath.   Cardiovascular:  Negative for chest pain.  Gastrointestinal:  Negative for abdominal pain, constipation, diarrhea, nausea and vomiting.  Genitourinary:  Negative for dysuria, flank pain, frequency, pelvic pain, urgency, vaginal bleeding and vaginal discharge.  Neurological:  Negative for dizziness, weakness, light-headedness and headaches.   Physical Exam   Blood pressure 135/85, pulse 100, temperature 98.9 F (37.2 C), resp. rate 18, height 5' 4.5" (1.638 m), weight 86.2 kg, last menstrual period 08/06/2021, SpO2 100 %.  Patient Vitals for the past 24 hrs:  BP Temp Temp src Pulse Resp SpO2 Height Weight  09/14/21 0043 135/85 98.9 F (37.2 C) -- 100 18 100 % 5' 4.5" (1.638 m) 86.2 kg  09/13/21 2227 131/80 -- -- 95 16 100 % -- --  09/13/21 1925 130/90 -- -- (!) 107 16 100 % -- --   09/13/21 1924 130/90 99.1 F (37.3 C) Oral 97 18 100 % -- --  09/13/21 1920 -- -- -- -- -- -- 5' 4.5" (1.638 m) 81.6 kg   Physical Exam Vitals and nursing note reviewed.  Constitutional:      General: She is not in acute distress.    Appearance: Normal appearance. She is not ill-appearing, toxic-appearing or diaphoretic.  HENT:     Head: Normocephalic and atraumatic.  Pulmonary:     Effort: Pulmonary effort is normal.  Neurological:     Mental Status: She is alert and oriented to person, place, and time.  Psychiatric:        Mood and Affect: Mood normal.        Behavior: Behavior normal.        Thought Content: Thought content normal.        Judgment: Judgment normal.    Results for orders placed or performed during the hospital encounter of 09/13/21 (from the past 24 hour(s))  ABO/Rh     Status: None   Collection Time: 09/13/21  8:34 PM  Result Value Ref Range   ABO/RH(D)      B POS Performed at [facility], [place] [address] [place], [place] [address]   Pregnancy, urine     Status: Abnormal   Collection Time: 09/13/21  8:34 PM  Result Value Ref Range   Preg Test, Ur POSITIVE (A) NEGATIVE  Urinalysis, Routine w reflex microscopic     Status: Abnormal   Collection Time: 09/13/21  8:34 PM  Result Value Ref Range   Color, Urine YELLOW YELLOW   APPearance CLEAR CLEAR   Specific Gravity, Urine 1.015 1.005 - 1.030   pH 7.0 5.0 - 8.0   Glucose, UA NEGATIVE NEGATIVE mg/dL   Hgb urine dipstick LARGE (A) NEGATIVE   Bilirubin Urine NEGATIVE NEGATIVE   Ketones, ur NEGATIVE NEGATIVE mg/dL   Protein, ur NEGATIVE NEGATIVE mg/dL   Nitrite NEGATIVE NEGATIVE   Leukocytes,Ua NEGATIVE NEGATIVE   RBC / HPF 0-5 0 - 5 RBC/hpf   WBC, UA 0-5 0 - 5 WBC/hpf   Squamous Epithelial / LPF 0-5 0 - 5  hCG, quantitative, pregnancy     Status: Abnormal   Collection Time: 09/13/21  8:35 PM  Result Value Ref Range   hCG, Beta Chain, Quant, S 1,130 (H) <5 mIU/mL  Comprehensive  metabolic panel     Status: None   Collection Time: 09/13/21  8:35 PM  Result Value Ref Range   Sodium 140 135 - 145 mmol/L   Potassium 3.8  3.5 - 5.1 mmol/L   Chloride 106 98 - 111 mmol/L   CO2 22 22 - 32 mmol/L   Glucose, Bld 88 70 - 99 mg/dL   BUN 8 6 - 20 mg/dL   Creatinine, Ser 0.73 0.44 - 1.00 mg/dL   Calcium 9.2 8.9 - 10.3 mg/dL   Total Protein 7.5 6.5 - 8.1 g/dL   Albumin 4.2 3.5 - 5.0 g/dL   AST 16 15 - 41 U/L   ALT 10 0 - 44 U/L   Alkaline Phosphatase 59 38 - 126 U/L   Total Bilirubin 0.3 0.3 - 1.2 mg/dL   GFR, Estimated >60 >60 mL/min   Anion gap 12 5 - 15  CBC with Differential     Status: Abnormal   Collection Time: 09/13/21  8:35 PM  Result Value Ref Range   WBC 8.7 4.0 - 10.5 K/uL   RBC 4.09 3.87 - 5.11 MIL/uL   Hemoglobin 11.9 (L) 12.0 - 15.0 g/dL   HCT 36.6 36.0 - 46.0 %   MCV 89.5 80.0 - 100.0 fL   MCH 29.1 26.0 - 34.0 pg   MCHC 32.5 30.0 - 36.0 g/dL   RDW 13.8 11.5 - 15.5 %   Platelets 373 150 - 400 K/uL   nRBC 0.0 0.0 - 0.2 %   Neutrophils Relative % 47 %   Neutro Abs 4.1 1.7 - 7.7 K/uL   Lymphocytes Relative 43 %   Lymphs Abs 3.7 0.7 - 4.0 K/uL   Monocytes Relative 8 %   Monocytes Absolute 0.7 0.1 - 1.0 K/uL   Eosinophils Relative 2 %   Eosinophils Absolute 0.1 0.0 - 0.5 K/uL   Basophils Relative 0 %   Basophils Absolute 0.0 0.0 - 0.1 K/uL   Immature Granulocytes 0 %   Abs Immature Granulocytes 0.02 0.00 - 0.07 K/uL   US OB LESS THAN 14 WEEKS WITH OB TRANSVAGINAL  Addendum Date: 09/13/2021   ADDENDUM REPORT: 09/13/2021 22:39 ADDENDUM: These results were called by telephone at the time of interpretation on 09/13/2021 at 10:39 pm to provider [facility] , who verbally acknowledged these results. Electronically Signed   By: Iven Finn M.D.   On: 09/13/2021 22:39   Result Date: 09/13/2021 CLINICAL DATA:  Vaginal bleeding. Quantitative beta HCG 1,130. Positive chlamydia. Last menstrual period 08/06/2021. Gestational age by last menstrual of 5  weeks and 3 days. Estimated due date 05/13/2022. EXAM: OBSTETRIC <14 WK [place] AND TRANSVAGINAL OB US TECHNIQUE: Both transabdominal and transvaginal ultrasound examinations were performed for complete evaluation of the gestation as well as the maternal uterus, adnexal regions, and pelvic cul-de-sac. Transvaginal technique was performed to assess early pregnancy. COMPARISON:  None Available. FINDINGS: Intrauterine gestational sac: None Yolk sac:  Not Visualized. Embryo:  Not Visualized. Cardiac Activity: Not Visualized. Maternal uterus/adnexae: There is a left adnexal mass measuring 0.8 x 0.8 x 0.7 cm with associated vascularity that moves independent from the left ovary when pressure is applied. No embryo noted within the lesion. The adjacent left ovary is unremarkable. The right ovary and adnexa are unremarkable. Other: No free fluid within the pelvis. IMPRESSION: 1. A subcentimeter left adnexal mass is consistent with an unruptured tubal ectopic pregnancy. 2. No intrauterine pregnancy. Electronically Signed: By: Iven Finn M.D. On: 09/13/2021 22:29    MAU Course  Procedures  MDM -r/o ectopic -UA: lg hgb, otherwise WNL -CBC: WNL -CMP: WNL -[place]: left ectopic pregnancy -hCG: 1,130 -ABO: B Positive  The risks of methotrexate were reviewed including  failure requiring repeat dosing or eventual surgery. She understands that methotrexate involves frequent return visits to monitor lab values and that she remains at risk of ectopic rupture until her beta is less than assay. ?The patient opts to proceed with methotrexate.  She has no history of hepatic or renal dysfunction, has normal BUN/Cr/LFT's/platelets.  She is felt to be reliable for follow-up. Side effects of photosensitivity & GI upset were discussed.  She knows to avoid direct sunlight and abstain from alcohol, NSAIDs and sexual intercourse for two weeks. She was counseled to discontinue any MVI with folic acid. ?She understands to follow up on D4  (09/17/2021 - MAU) and D7 ([facility] for repeat BHCG and was given the instruction sheet. ?Strict ectopic precautions were reviewed, the patient knows to call with any abdominal pain, vomiting, fainting, or any concerns with her health.  Day 0/1 Day 4 Day 7  Sunday Wednesday Saturday  Monday Thursday Sunday  Tuesday Friday Monday  Wednesday Saturday Tuesday  Thursday Sunday Wednesday  Friday Monday Thursday  Saturday Tuesday Friday    Methotrexate Treatment Protocol for Ectopic Pregnancy  [x] Pretreatment testing and instructions  [x] hCG concentration  [x] Transvaginal ultrasound  [x] Blood group and Rh(D) typing; give Rhogam 300 mcg IM, if indicated  [x] Complete blood count  [x] Liver and renal function tests  [x] Discontinue folic acid supplements  [x] Counsel patient to avoid NSAIDs, recommend acetaminophen if an analgesic is needed  [x] Advise patient to refrain from sexual intercourse and strenuous exercise  Treatment day  Single dose protocol   1  hCG.  Administer Methotrexate 50 mg/m2 body surface area IM  4  hCG  7  hCG  If <15 percent hCG decline from day 4 to 7, give additional dose of methotrexate 50 mg/m2 IM  If ?15 percent hCG decline from day 4 to 7, draw hCG weekly until undetectable  14  hCG  If <15 percent hCG decline from day 7 to 14, give additional dose of methotrexate 50 mg/m2 IM  If ?15 percent hCG decline from day 7 to 14, check hCG weekly until undetectable  21 and 28  If 3 doses have been given and there is a <15 percent hCG decline from day 21 to 28, proceed with laparoscopic surgery  Laparoscopy  If severe abdominal pain or an acute abdomen suggestive of tubal rupture occurs If ultrasonography reveals greater than 300 mL pelvic or other intraperitoneal fluid  The hCG concentration usually declines to less than 15 mIU/mL by 35 days postinjection but may take as long as 109 days. If the hCG does not decline to zero, a new pregnancy should be excluded; if  the hCG is rising, a transvaginal ultrasound should be performed. Alternatively, some patients have a slow clearance of serum hCG. If three weekly values are similar, consider an additional dose of MTX (50 mg/m2) not to exceed the recommended maximum of three total doses. This typically accelerates the decline of serum hCG. The risk of gestational trophoblastic disease is low. Folinic acid rescue is not required for women treated with the single-dose protocol, even if multiple doses are ultimately given.   Prepared with data from:   [facility]. Clinical practice. Ectopic pregnancy. [place] Med 2009; 361:379  American College of Obstetricians and Gynecologists. ACOG Practice Bulletin No. 94: Medical management of ectopic pregnancy. Obstet Gynecol 2008; [id number].  -pt discharged to home in stable condition  Orders Placed This Encounter  Procedures   US OB LESS THAN 14 WEEKS WITH OB TRANSVAGINAL  Standing Status:   Standing    Number of Occurrences:   1    Order Specific Question:   Symptom/Reason for Exam    Answer:   Vaginal bleeding [id number]   Pregnancy, urine    Standing Status:   Standing    Number of Occurrences:   1   Urinalysis, Routine w reflex microscopic    Standing Status:   Standing    Number of Occurrences:   1   hCG, quantitative, pregnancy    Standing Status:   Standing    Number of Occurrences:   1   Comprehensive metabolic panel    Standing Status:   Standing    Number of Occurrences:   1   CBC with Differential    Standing Status:   Standing    Number of Occurrences:   1   Secure IV for POV transfer    Standing Status:   Standing    Number of Occurrences:   1   ABO/Rh    Standing Status:   Standing    Number of Occurrences:   1   Meds ordered this encounter  Medications   methotrexate (for ectopic pregnancy) 25 mg/mL chemo injection   Assessment and Plan   1. Left tubal pregnancy without intrauterine pregnancy   2. Blood type, Rh positive      Allergies as of 09/14/2021   No Known Allergies      Medication List     STOP taking these medications    etonogestrel-ethinyl estradiol 0.12-0.015 MG/24HR vaginal ring Commonly known as: NUVARING   ibuprofen 400 MG tablet Commonly known as: ADVIL   naproxen 500 MG tablet Commonly known as: NAPROSYN   norelgestromin-ethinyl estradiol 150-35 MCG/24HR transdermal patch Commonly known as: XULANE   Syeda 3-0.03 MG tablet Generic drug: drospirenone-ethinyl estradiol       TAKE these medications    ciprofloxacin 0.3 % ophthalmic solution Commonly known as: CILOXAN Place 2 drops into the left eye every 4 (four) hours while awake. Administer 1 drop, every 2 hours, while awake, for 2 days. Then 1 drop, every 4 hours, while awake, for the next 5 days.   FLUoxetine 20 MG capsule Commonly known as: PROZAC Take 1 capsule (20 mg total) by mouth daily.   fluticasone 50 MCG/ACT nasal spray Commonly known as: FLONASE Place 2 sprays into both nostrils daily.   lidocaine 2 % solution Commonly known as: XYLOCAINE Use as directed 15 mLs in the mouth or throat every 8 (eight) hours as needed for mouth pain.   SINGULAIR PO Take by mouth.   ZYRTEC PO Take by mouth.       -strict ectopic precautions given -return appointments made -work note given per pt request -pt discharged to home in stable condition  Gerrie Nordmann Kirby Argueta 09/14/2021, 12:50 AM   Late entry made due to Epic downtime.

## 2021-09-16 ENCOUNTER — Emergency Department (HOSPITAL_BASED_OUTPATIENT_CLINIC_OR_DEPARTMENT_OTHER)
Admission: EM | Admit: 2021-09-16 | Discharge: 2021-09-16 | Disposition: A | Discharge status: Home / Self Care | Payer: Medicaid Other | Attending: Emergency Medicine | Admitting: Emergency Medicine

## 2021-09-16 ENCOUNTER — Other Ambulatory Visit: Payer: Self-pay

## 2021-09-16 ENCOUNTER — Encounter (HOSPITAL_BASED_OUTPATIENT_CLINIC_OR_DEPARTMENT_OTHER): Payer: Self-pay | Admitting: *Deleted

## 2021-09-16 DIAGNOSIS — O039 Complete or unspecified spontaneous abortion without complication: Secondary | ICD-10-CM

## 2021-09-16 DIAGNOSIS — O26891 Other specified pregnancy related conditions, first trimester: Secondary | ICD-10-CM | POA: Diagnosis present

## 2021-09-16 DIAGNOSIS — O200 Threatened abortion: Secondary | ICD-10-CM | POA: Insufficient documentation

## 2021-09-16 DIAGNOSIS — Z3A00 Weeks of gestation of pregnancy not specified: Secondary | ICD-10-CM | POA: Diagnosis not present

## 2021-09-16 LAB — BASIC METABOLIC PANEL
Anion gap: 9 (ref 5–15)
BUN: 6 mg/dL (ref 6–20)
CO2: 25 mmol/L (ref 22–32)
Calcium: 9.4 mg/dL (ref 8.9–10.3)
Chloride: 104 mmol/L (ref 98–111)
Creatinine, Ser: 0.75 mg/dL (ref 0.44–1.00)
GFR, Estimated: >60 mL/min (ref >60)
Glucose, Bld: 85 mg/dL (ref 70–99)
Potassium: 3.6 mmol/L (ref 3.5–5.1)
Sodium: 138 mmol/L (ref 135–145)

## 2021-09-16 LAB — CBC
HCT: 36 % (ref 36.0–46.0)
Hemoglobin: 11.6 g/dL — ABNORMAL LOW (ref 12.0–15.0)
MCH: 29.1 pg (ref 26.0–34.0)
MCHC: 32.2 g/dL (ref 30.0–36.0)
MCV: 90.5 fL (ref 80.0–100.0)
Platelets: 352 10*3/uL (ref 150–400)
RBC: 3.98 MIL/uL (ref 3.87–5.11)
RDW: 13.5 % (ref 11.5–15.5)
WBC: 6.6 10*3/uL (ref 4.0–10.5)
nRBC: 0 % (ref 0.0–0.2)

## 2021-09-16 LAB — HCG, QUANTITATIVE, PREGNANCY: hCG, Beta Chain, Quant, S: 1684 m[IU]/mL — ABNORMAL HIGH (ref <5)

## 2021-09-16 NOTE — ED Notes (Signed)
Discharge instructions discussed with pt pt verablzied understanding with no questions at this time. Pt to go home with s/o at bedside

## 2021-09-16 NOTE — Consult Note (Signed)
   OB/GYN Telephone Consult  09/16/21  Lauren Wilkinson is a 21 y.o. G1P0 with known ectopic pregnancy who presented to ED with abdominal pain and bleeding.  I was called for a consult regarding the care of this patient by [facility].    The provider had a clinical question about hCG level  The provider presented the following relevant clinical information: -Pt recently seen in MAU 6/8 and treated with MTX -Presented to ED due to bleeding and cramping.  Per ED physician pt was concerned and wanted to make sure this was normal -Reported that patient appeared stable, normal VS and stable Hgb -Concern was regarding rising HCG level  I performed a chart review on the patient and reviewed available documentation.  BP 107/76   Pulse 100   Temp 98.4 F (36.9 C)   Resp 17   LMP 08/06/2021 (Approximate)   SpO2 99%   Exam- performed by consulting provider   Recommendations:  -Reviewed MTX protocol- Typically HCG repeat on Day 1, 4 and 7.   -It can be anticipated that you may not see a decline by Day 4 -looking for 15% drop by Day 7 -since today is only Day 2 today's HCG does not change current management -if pt is stable with no signs of rupture, ok to discharge with plans to follow up as scheduled  Thank you for this consult and if additional recommendations are needed please call [phone] for the OB/GYN attending on service at [facility].   I spent approximately 5 minutes directly consulting with the provider and verbally discussing this case. Additionally 5 minutes minutes was spent performing chart review and documentation.   Myna Hidalgo, DO Attending Obstetrician & Gynecologist, [facility] for [employer], [facility]

## 2021-09-16 NOTE — ED Provider Notes (Signed)
[place] EMERGENCY DEPT Provider Note   CSN: [id number] Arrival date & time: 09/16/21  1951     History  Chief Complaint  Patient presents with   Abdominal Pain    Lauren Wilkinson is a 21 y.o. female with a past medical history significant for recent diagnosis for ectopic pregnancy and left fallopian tube just 3 days ago who was given methotrexate.  Patient reports that since then she has had a few episodes of sharp lower abdominal pain, pressure especially with movement, as well as she has had ongoing vaginal bleeding since the first of the month.  Patient denies any feeling of lightheadedness, fever, chills, reports abdominal pain is overall improved with rest.   Abdominal Pain Associated symptoms: vaginal bleeding        Home Medications Prior to Admission medications   Medication Sig Start Date End Date Taking? Authorizing Provider  Cetirizine HCl (ZYRTEC PO) Take by mouth.    [provider]  ciprofloxacin (CILOXAN) 0.3 % ophthalmic solution Place 2 drops into the left eye every 4 (four) hours while awake. Administer 1 drop, every 2 hours, while awake, for 2 days. Then 1 drop, every 4 hours, while awake, for the next 5 days. 07/09/21   Azucena Cecil, PA-C  FLUoxetine (PROZAC) 20 MG capsule Take 1 capsule (20 mg total) by mouth daily. 10/02/19 12/31/19  Deloria Lair, NP  fluticasone (FLONASE) 50 MCG/ACT nasal spray Place 2 sprays into both nostrils daily. 12/24/16   Waynetta Pean, PA-C  lidocaine (XYLOCAINE) 2 % solution Use as directed 15 mLs in the mouth or throat every 8 (eight) hours as needed for mouth pain. 05/29/21   Henderly, Britni A, PA-C  Montelukast Sodium (SINGULAIR PO) Take by mouth.    [provider]      Allergies    Patient has no known allergies.    Review of Systems   Review of Systems  Gastrointestinal:  Positive for abdominal pain.  Genitourinary:  Positive for vaginal bleeding.  All other systems reviewed and  are negative.   Physical Exam Updated Vital Signs BP 107/76   Pulse 100   Temp 98.4 F (36.9 C)   Resp 17   LMP 08/06/2021 (Approximate)   SpO2 99%  Physical Exam Vitals and nursing note reviewed.  Constitutional:      General: She is not in acute distress.    Appearance: Normal appearance. She is not ill-appearing.  HENT:     Head: Normocephalic and atraumatic.  Eyes:     General:        Right eye: No discharge.        Left eye: No discharge.  Cardiovascular:     Rate and Rhythm: Normal rate and regular rhythm.     Heart sounds: No murmur heard.    No friction rub. No gallop.  Pulmonary:     Effort: Pulmonary effort is normal.     Breath sounds: Normal breath sounds.  Abdominal:     General: Bowel sounds are normal.     Palpations: Abdomen is soft.     Comments: Patient with some tenderness to palpation especially in suprapubic and left lower quadrant regions.  No rebound, rigidity, guarding.  Genitourinary:    Comments: Cervical os minimally open with bloody / mucoid discharge. TTP in right adnexa and cervix without true CMT. No TTP in left adnexa on bimanual exam. Skin:    General: Skin is warm and dry.     Capillary Refill: Capillary  refill takes less than 2 seconds.  Neurological:     Mental Status: She is alert and oriented to person, place, and time.  Psychiatric:        Mood and Affect: Mood normal.        Behavior: Behavior normal.     ED Results / Procedures / Treatments   Labs (all labs ordered are listed, but only abnormal results are displayed) Labs Reviewed  CBC - Abnormal; Notable for the following components:      Result Value   Hemoglobin 11.6 (*)    All other components within normal limits  HCG, QUANTITATIVE, PREGNANCY - Abnormal; Notable for the following components:   hCG, Beta Chain, Quant, S 1,684 (*)    All other components within normal limits  BASIC METABOLIC PANEL    EKG None  Radiology No results  found.  Procedures Procedures    Medications Ordered in ED Medications - No data to display  ED Course/ Medical Decision Making/ A&P Clinical Course as of 09/17/21 1209  Sat Sep 17, 6691  4521 20 year old female status post methotrexate for ectopic pregnancy.  Here with some abdominal pain and bleeding.  Benign abdominal exam.  Hemoglobin stable.  Discussed with Dr. Nelda Marseille gynecology who said this is expected management and even despite the rising quant this is not unusual.  She needs to follow-up on Monday as scheduled. [MB]    Clinical Course User Index [MB] Hayden Rasmussen, MD                           Medical Decision Making Amount and/or Complexity of Data Reviewed Labs: ordered.   This patient is a 21 y.o. female who presents to the ED for concern of vaginal bleeding, abdominal cramping in context of recent ectopic pregnancy, who received MTX 2 days PTA, this involves an extensive number of treatment options, and is a complaint that carries with it a high risk of complications and morbidity. The emergent differential diagnosis prior to evaluation includes, but is not limited to,  incomplete miscarriage, ruptured ectopic vs other DUB / AUB .   This is not an exhaustive differential.   Past Medical History / Co-morbidities / Social History: Patient with recent left tubal ectopic, treated 2 days PTA with mtx  Additional history: Chart reviewed. Pertinent results include: reviewed OBGYN notes, labwork, imaging including US showing left tubal ectopic  Physical Exam: Physical exam performed. The pertinent findings include: non-septic, non-toxic appearing. Some ttp in right adnexa and cervix on bimanual. No acute hemorrhage from cervical os. Overall reassuring abdominal exam.   11:00p Care of [place] transferred to Dr. Melina Copa at the end of my shift as the patient will require reassessment once labs/imaging have resulted. Patient presentation, ED course, and plan of care  discussed with review of all pertinent labs and imaging. Please see his/her note for further details regarding further ED course and disposition. Plan at time of handoff is pending labwork, quant HCG for monitoring of intended miscarriage of ectopic pregnancy. If labwork shows stable to downtrending HCG, patient remains stable, then we will discharge home with OB follow up. This may be altered or completely changed at the discretion of the oncoming team pending results of further workup.  I discussed this case with my attending physician Dr. Melina Copa who cosigned this note including patient's presenting symptoms, physical exam, and planned diagnostics and interventions. Attending physician stated agreement with plan or made changes to plan  which were implemented.    Final Clinical Impression(s) / ED Diagnoses Final diagnoses:  Miscarriage    Rx / DC Orders ED Discharge Orders     None         Dorien Chihuahua 09/17/21 1209    Hayden Rasmussen, MD 09/18/21 1141

## 2021-09-16 NOTE — Discharge Instructions (Signed)
You were seen for pelvic pain and vaginal bleeding in the setting of treatment for miscarriage.  We reviewed your case with GYN on-call and they felt that you are safe to follow-up as scheduled on Monday.  Please stay well-hydrated and can use Tylenol and ibuprofen for pain.  Return to the emergency department if any worsening or concerning symptoms.

## 2021-09-16 NOTE — ED Triage Notes (Signed)
Patient reports ectopic pregnancy, LMP was April 30, was seen and evaluated at [facility], was given methotrexate. States she has been bleeding since the 1st of this month. Reports sharp pain with episodes of intermittent severe ness across lower abdomen. Denies n/v.

## 2021-09-17 ENCOUNTER — Inpatient Hospital Stay (HOSPITAL_COMMUNITY): Payer: Medicaid Other

## 2021-09-19 ENCOUNTER — Ambulatory Visit: Payer: Medicaid Other

## 2021-09-20 ENCOUNTER — Ambulatory Visit (INDEPENDENT_AMBULATORY_CARE_PROVIDER_SITE_OTHER): Payer: Medicaid Other

## 2021-09-20 VITALS — BP 137/83 | HR 99 | Wt 186.9 lb

## 2021-09-20 DIAGNOSIS — Z5181 Encounter for therapeutic drug level monitoring: Secondary | ICD-10-CM

## 2021-09-20 DIAGNOSIS — Z79631 Long term (current) use of antimetabolite agent: Secondary | ICD-10-CM

## 2021-09-20 LAB — BETA HCG QUANT (REF LAB): hCG Quant: 1520 m[IU]/mL

## 2021-09-20 NOTE — Progress Notes (Cosign Needed Addendum)
Beta HCG Follow-up Visit  Lauren Wilkinson presents to [facility] for follow-up beta HCG lab. She was seen in [facility]U for vaginal bleeding on 09/14/21. She received methotrexate for ectopic pregnancy. Patient denies any pain today. Patient states she is still experiencing some vaginal spotting. Discussed with patient that we are following beta HCG levels today. Results will be back in approximately 2-4 hours. Valid contact number for patient confirmed. I will call patient with the results.   Beta HCG results: 09/13/21 HCG 1,130  09/16/21 HCG 1,684  09/20/21 HCG 1,520    Results and patient chart reviewed with Dr. Ashok Pall who states results show appropriate decrease. Per Dr. Ashok Pall patient should have a follow up beta HCG in 1 week. I called patient and reviewed results and follow up plan with her. Patient verbalized understanding and denies any questions.   Lauren Wilkinson 09/20/2021

## 2021-09-22 ENCOUNTER — Other Ambulatory Visit: Payer: Self-pay | Admitting: *Deleted

## 2021-09-22 DIAGNOSIS — Z79631 Encounter for therapeutic drug level monitoring: Secondary | ICD-10-CM

## 2021-09-27 ENCOUNTER — Other Ambulatory Visit: Payer: Medicaid Other

## 2021-09-27 ENCOUNTER — Emergency Department (HOSPITAL_BASED_OUTPATIENT_CLINIC_OR_DEPARTMENT_OTHER)
Admission: EM | Admit: 2021-09-27 | Discharge: 2021-09-28 | Disposition: A | Discharge status: Home / Self Care | Payer: Medicaid Other | Attending: Emergency Medicine | Admitting: Emergency Medicine

## 2021-09-27 ENCOUNTER — Emergency Department (HOSPITAL_BASED_OUTPATIENT_CLINIC_OR_DEPARTMENT_OTHER): Payer: Medicaid Other

## 2021-09-27 ENCOUNTER — Encounter (HOSPITAL_BASED_OUTPATIENT_CLINIC_OR_DEPARTMENT_OTHER): Payer: Self-pay

## 2021-09-27 ENCOUNTER — Other Ambulatory Visit: Payer: Self-pay

## 2021-09-27 DIAGNOSIS — O26899 Other specified pregnancy related conditions, unspecified trimester: Secondary | ICD-10-CM | POA: Diagnosis present

## 2021-09-27 DIAGNOSIS — Z5181 Encounter for therapeutic drug level monitoring: Secondary | ICD-10-CM

## 2021-09-27 DIAGNOSIS — O209 Hemorrhage in early pregnancy, unspecified: Secondary | ICD-10-CM | POA: Insufficient documentation

## 2021-09-27 DIAGNOSIS — O00102 Left tubal pregnancy without intrauterine pregnancy: Secondary | ICD-10-CM

## 2021-09-27 DIAGNOSIS — Z3A00 Weeks of gestation of pregnancy not specified: Secondary | ICD-10-CM | POA: Diagnosis not present

## 2021-09-27 DIAGNOSIS — N9489 Other specified conditions associated with female genital organs and menstrual cycle: Secondary | ICD-10-CM | POA: Diagnosis not present

## 2021-09-27 DIAGNOSIS — Z79631 Long term (current) use of antimetabolite agent: Secondary | ICD-10-CM

## 2021-09-27 LAB — URINALYSIS, ROUTINE W REFLEX MICROSCOPIC
Bilirubin Urine: NEGATIVE
Glucose, UA: NEGATIVE mg/dL
Ketones, ur: NEGATIVE mg/dL
Leukocytes,Ua: NEGATIVE
Nitrite: NEGATIVE
Protein, ur: NEGATIVE mg/dL
Specific Gravity, Urine: 1.012 (ref 1.005–1.030)
pH: 7 (ref 5.0–8.0)

## 2021-09-27 LAB — COMPREHENSIVE METABOLIC PANEL
ALT: 10 U/L (ref 0–44)
AST: 14 U/L — ABNORMAL LOW (ref 15–41)
Albumin: 4.6 g/dL (ref 3.5–5.0)
Alkaline Phosphatase: 67 U/L (ref 38–126)
Anion gap: 9 (ref 5–15)
BUN: 5 mg/dL — ABNORMAL LOW (ref 6–20)
CO2: 26 mmol/L (ref 22–32)
Calcium: 9.7 mg/dL (ref 8.9–10.3)
Chloride: 107 mmol/L (ref 98–111)
Creatinine, Ser: 0.88 mg/dL (ref 0.44–1.00)
GFR, Estimated: >60 mL/min (ref >60)
Glucose, Bld: 86 mg/dL (ref 70–99)
Potassium: 3.5 mmol/L (ref 3.5–5.1)
Sodium: 142 mmol/L (ref 135–145)
Total Bilirubin: 0.3 mg/dL (ref 0.3–1.2)
Total Protein: 7.9 g/dL (ref 6.5–8.1)

## 2021-09-27 LAB — CBC
HCT: 38.2 % (ref 36.0–46.0)
Hemoglobin: 12.4 g/dL (ref 12.0–15.0)
MCH: 28.8 pg (ref 26.0–34.0)
MCHC: 32.5 g/dL (ref 30.0–36.0)
MCV: 88.8 fL (ref 80.0–100.0)
Platelets: 371 10*3/uL (ref 150–400)
RBC: 4.3 MIL/uL (ref 3.87–5.11)
RDW: 13.5 % (ref 11.5–15.5)
WBC: 6.7 10*3/uL (ref 4.0–10.5)
nRBC: 0 % (ref 0.0–0.2)

## 2021-09-27 LAB — HCG, QUANTITATIVE, PREGNANCY: hCG, Beta Chain, Quant, S: 316 m[IU]/mL — ABNORMAL HIGH (ref <5)

## 2021-09-27 LAB — PREGNANCY, URINE: Preg Test, Ur: POSITIVE — AB

## 2021-09-27 LAB — LIPASE, BLOOD: Lipase: 34 U/L (ref 11–51)

## 2021-09-27 NOTE — ED Notes (Signed)
ED Provider at bedside. 

## 2021-09-27 NOTE — ED Notes (Addendum)
HCG level last drawn 09/16/21 1684 - per OB notes at least 15% decline 1392.3) should be expected by day 7 after Methotrexate administration

## 2021-09-27 NOTE — ED Notes (Signed)
Pt remains awake and alert sitting up in bed- no acute distress noted.  Pt reports intermittent generalized abd pain described as aching with intermittent nausea.  Pt states repeat HcG level drawn at [facility] however pt stated she wants an ultrasound "to make sure everything is ok".  Explained to patient that provider will confirm plan once pt is seen -- also advised pt to refrain from eating and drinking until cleared by provider (pt was eating snacks when this nurse entered room)

## 2021-09-27 NOTE — ED Triage Notes (Signed)
Patient here POV from Home.  Endorses Lower ABD Pain (Mostly Right) for approximately 3 Days.   Ectopic Pregnancy 3 Weeks ago (Given Rhogam).   No N/V/D. No Fevers. No Constipation.  NAD Noted during Triage. A&Ox4. Gcs 15. Ambulatory.

## 2021-09-27 NOTE — ED Provider Notes (Cosign Needed)
[facility] EMERGENCY DEPT Provider Note   CSN: [id number] Arrival date & time: 09/27/21  1817     History  Chief Complaint  Patient presents with   Abdominal Pain    Lauren Wilkinson is a 21 y.o. female with recent admission for ectopic pregnancy on 09/13/2021, 2 weeks ago, who presents to the ED for ongoing pelvic discomfort and vaginal bleeding.  Patient was diagnosed with left-sided ectopic pregnancy and given a dose of methotrexate.  She was discharged home with instructions to follow-up with the [facility].  A few days after she was discharged, she returned to the emergency department stating that her pain is continued and she is having continued bleeding.  Patient was reassured that this was normal after administration of methotrexate.  Since then, patient has had continued abdominal pain and she is now concerned that she has had significant bleeding over the last few days.  She reports soaking through 1 pad or tampon every 2 hours since yesterday.  She reportedly had an appointment with [facility] for evaluation today and had an hCG quant sent out that will not result until tomorrow.  However, she is very concerned by the degree of bleeding and came here to the emergency department in order to obtain an ultrasound and for evaluation.  Patient denies urinary symptoms, chest pain, shortness of breath, nausea, vomiting and diarrhea.   Abdominal Pain      Home Medications Prior to Admission medications   Medication Sig Start Date End Date Taking? Authorizing Provider  Cetirizine HCl (ZYRTEC PO) Take by mouth. Patient not taking: Reported on 09/20/2021    [provider]  ciprofloxacin (CILOXAN) 0.3 % ophthalmic solution Place 2 drops into the left eye every 4 (four) hours while awake. Administer 1 drop, every 2 hours, while awake, for 2 days. Then 1 drop, every 4 hours, while awake, for the next 5 days. Patient not taking: Reported on 09/20/2021 07/09/21    Al Decant, PA-C  FLUoxetine (PROZAC) 20 MG capsule Take 1 capsule (20 mg total) by mouth daily. 10/02/19 12/31/19  Jearld Lesch, NP  fluticasone (FLONASE) 50 MCG/ACT nasal spray Place 2 sprays into both nostrils daily. Patient not taking: Reported on 09/20/2021 12/24/16   Everlene Farrier, PA-C  lidocaine (XYLOCAINE) 2 % solution Use as directed 15 mLs in the mouth or throat every 8 (eight) hours as needed for mouth pain. Patient not taking: Reported on 09/20/2021 05/29/21   Henderly, Britni A, PA-C  Montelukast Sodium (SINGULAIR PO) Take by mouth. Patient not taking: Reported on 09/20/2021    [provider]      Allergies    Patient has no known allergies.    Review of Systems   Review of Systems  Gastrointestinal:  Positive for abdominal pain.    Physical Exam Updated Vital Signs BP (!) 110/96   Pulse (!) 102   Temp (!) 97.5 F (36.4 C) (Oral)   Resp 16   Ht 5' 4.5" (1.638 m)   Wt 84.8 kg   LMP 08/06/2021 (Approximate)   SpO2 100%   BMI 31.59 kg/m  Physical Exam Vitals and nursing note reviewed.  Constitutional:      General: She is not in acute distress.    Appearance: She is not ill-appearing.  HENT:     Head: Atraumatic.  Eyes:     Conjunctiva/sclera: Conjunctivae normal.  Cardiovascular:     Rate and Rhythm: Normal rate and regular rhythm.  Pulses: Normal pulses.     Heart sounds: No murmur heard. Pulmonary:     Effort: Pulmonary effort is normal. No respiratory distress.     Breath sounds: Normal breath sounds.  Abdominal:     General: Abdomen is flat. There is no distension.     Palpations: Abdomen is soft.     Tenderness: There is no abdominal tenderness. There is no right CVA tenderness or left CVA tenderness. Negative signs include Murphy's sign and McBurney's sign.     Comments: No obvious reproducible tenderness to palpation, although when pressing in the right pelvic region, patient notes that she feels cramping in that location   Musculoskeletal:        General: Normal range of motion.     Cervical back: Normal range of motion.  Skin:    General: Skin is warm and dry.     Capillary Refill: Capillary refill takes less than 2 seconds.  Neurological:     General: No focal deficit present.     Mental Status: She is alert.  Psychiatric:        Mood and Affect: Mood normal.     ED Results / Procedures / Treatments   Labs (all labs ordered are listed, but only abnormal results are displayed) Labs Reviewed  COMPREHENSIVE METABOLIC PANEL - Abnormal; Notable for the following components:      Result Value   BUN 5 (*)    AST 14 (*)    All other components within normal limits  URINALYSIS, ROUTINE W REFLEX MICROSCOPIC - Abnormal; Notable for the following components:   Hgb urine dipstick MODERATE (*)    All other components within normal limits  PREGNANCY, URINE - Abnormal; Notable for the following components:   Preg Test, Ur POSITIVE (*)    All other components within normal limits  LIPASE, BLOOD  CBC  HCG, QUANTITATIVE, PREGNANCY    EKG None  Radiology No results found.  Procedures Procedures    Medications Ordered in ED Medications - No data to display  ED Course/ Medical Decision Making/ A&P                           Medical Decision Making Amount and/or Complexity of Data Reviewed Labs: ordered. Radiology: ordered.   Social determinants of health:  Social History   Socioeconomic History   Marital status: Single    Spouse name: Not on file   Number of children: Not on file   Years of education: Not on file   Highest education level: Not on file  Occupational History   Not on file  Tobacco Use   Smoking status: Never   Smokeless tobacco: Never  Vaping Use   Vaping Use: Never used  Substance and Sexual Activity   Alcohol use: No   Drug use: No   Sexual activity: Not on file  Other Topics Concern   Not on file  Social History Narrative   Not on file   Social  Determinants of Health   Financial Resource Strain: Not on file  Food Insecurity: Not on file  Transportation Needs: Not on file  Physical Activity: Not on file  Stress: Not on file  Social Connections: Not on file  Intimate Partner Violence: Not on file     Initial impression:  This patient presents to the ED for concern of vaginal bleeding and discharge after ectopic pregnancy 2 weeks ago, this involves an extensive number of treatment options,  and is a complaint that carries with it a high risk of complications and morbidity.   Differentials include ruptured ectopic pregnancy, methotrexate side effects, hormonal changes, ovarian torsion  Comorbidities affecting care:  None  Additional history obtained: Chart review, admission records from 09/13/2021,  Lab Tests  I Ordered, reviewed, and interpreted labs and EKG.  The pertinent results include:  Hemoglobin 12.4, hematocrit 38.2 CMP, UA lipase unremarkable Pending hCG quant  Imaging Studies ordered:  I ordered imaging studies including  Pending pelvic ultrasound I independently visualized and interpreted imaging and I agree with the radiologist interpretation.   Consultations Obtained:  I requested consultation with OB/GYN and spoke with Dr. Debroah Loop,  and discussed lab and imaging findings as well as pertinent plan - they recommend: Patient needs to maintain her follow-up appointments for her evaluations post methotrexate.  However since she is here in the emergency department, he recommends obtaining ultrasound and hCG quant for patient reassurance.   ED Course/Re-evaluation: 21 year old female presents to the ED for evaluation of ongoing vaginal bleeding and pelvic pain after ectopic pregnancy 2 weeks ago.  Vitals are without significant abnormality, patient is alert, friendly and in no acute distress.  She has no obvious tenderness to palpation and states pain is mostly cramping sensation.  She is requesting ultrasound out  of concern for the excessive vaginal bleeding.  Hemoglobin and hematocrit are normal.  Labs otherwise reassuring.  I spoke with Dr. Debroah Loop who recommends vaginal ultrasound and hCG quant for patient reassurance. Patient care handed off to Dr. Deretha Emory at shift change who will monitor for patient's hCG quant and ultrasound with likely discharge afterwards. Final Clinical Impression(s) / ED Diagnoses Final diagnoses:  None    Rx / DC Orders ED Discharge Orders     None         Janell Quiet, [place] 09/27/21 2213

## 2021-09-27 NOTE — ED Notes (Signed)
Pt ambulatory from waiting room at this time escorted by charge nurse and visitor-- gait steady; no obvious acute distress noted.

## 2021-09-27 NOTE — ED Notes (Addendum)
Pt now returned from u/s via w/c -- pt remains awake and alert; pleasant affect - no acute distress.  HcG quant level down to 316

## 2021-09-27 NOTE — ED Notes (Signed)
Patient transported to Ultrasound via w/c escorted by ultrasound tech at this time; pt remains awake and alert; no acute changes noted

## 2021-09-28 LAB — BETA HCG QUANT (REF LAB): hCG Quant: 263 m[IU]/mL

## 2021-09-28 NOTE — Discharge Instructions (Addendum)
Follow-up with Dr. Waylan Boga for women's health.  Information provided above call tomorrow to make an appointment.  Work note provided.  Return for any passing out or severe abdominal pain.

## 2021-09-28 NOTE — ED Notes (Signed)
Pt agreeable with d/c plan as discussed by provider- this nurse has verbally reinforced d/c instructions and provided pt with written copy- pt acknowledges verbal understanding and denies any additional questions, concerns, needs- ambulatory independently at d/c; no distress - escorted by friend

## 2022-01-24 ENCOUNTER — Emergency Department (HOSPITAL_BASED_OUTPATIENT_CLINIC_OR_DEPARTMENT_OTHER): Payer: Medicaid Other

## 2022-01-24 ENCOUNTER — Emergency Department (HOSPITAL_BASED_OUTPATIENT_CLINIC_OR_DEPARTMENT_OTHER)
Admission: EM | Admit: 2022-01-24 | Discharge: 2022-01-24 | Disposition: A | Discharge status: Home / Self Care | Payer: Medicaid Other | Attending: Emergency Medicine | Admitting: Emergency Medicine

## 2022-01-24 ENCOUNTER — Encounter (HOSPITAL_BASED_OUTPATIENT_CLINIC_OR_DEPARTMENT_OTHER): Payer: Self-pay

## 2022-01-24 ENCOUNTER — Other Ambulatory Visit: Payer: Self-pay

## 2022-01-24 DIAGNOSIS — Z3A00 Weeks of gestation of pregnancy not specified: Secondary | ICD-10-CM | POA: Insufficient documentation

## 2022-01-24 DIAGNOSIS — R1030 Lower abdominal pain, unspecified: Secondary | ICD-10-CM | POA: Diagnosis not present

## 2022-01-24 DIAGNOSIS — O26891 Other specified pregnancy related conditions, first trimester: Secondary | ICD-10-CM | POA: Insufficient documentation

## 2022-01-24 LAB — URINALYSIS, ROUTINE W REFLEX MICROSCOPIC
Bilirubin Urine: NEGATIVE
Glucose, UA: NEGATIVE mg/dL
Hgb urine dipstick: NEGATIVE
Ketones, ur: NEGATIVE mg/dL
Leukocytes,Ua: NEGATIVE
Nitrite: NEGATIVE
Protein, ur: 30 mg/dL — AB
Specific Gravity, Urine: 1.035 — ABNORMAL HIGH (ref 1.005–1.030)
pH: 6.5 (ref 5.0–8.0)

## 2022-01-24 LAB — COMPREHENSIVE METABOLIC PANEL
ALT: 6 U/L (ref 0–44)
AST: 11 U/L — ABNORMAL LOW (ref 15–41)
Albumin: 4.3 g/dL (ref 3.5–5.0)
Alkaline Phosphatase: 69 U/L (ref 38–126)
Anion gap: 10 (ref 5–15)
BUN: 9 mg/dL (ref 6–20)
CO2: 23 mmol/L (ref 22–32)
Calcium: 9.4 mg/dL (ref 8.9–10.3)
Chloride: 105 mmol/L (ref 98–111)
Creatinine, Ser: 0.78 mg/dL (ref 0.44–1.00)
GFR, Estimated: >60 mL/min (ref >60)
Glucose, Bld: 107 mg/dL — ABNORMAL HIGH (ref 70–99)
Potassium: 3.4 mmol/L — ABNORMAL LOW (ref 3.5–5.1)
Sodium: 138 mmol/L (ref 135–145)
Total Bilirubin: 0.3 mg/dL (ref 0.3–1.2)
Total Protein: 7.4 g/dL (ref 6.5–8.1)

## 2022-01-24 LAB — CBC
HCT: 37.3 % (ref 36.0–46.0)
Hemoglobin: 12.5 g/dL (ref 12.0–15.0)
MCH: 29.6 pg (ref 26.0–34.0)
MCHC: 33.5 g/dL (ref 30.0–36.0)
MCV: 88.4 fL (ref 80.0–100.0)
Platelets: 338 10*3/uL (ref 150–400)
RBC: 4.22 MIL/uL (ref 3.87–5.11)
RDW: 13.5 % (ref 11.5–15.5)
WBC: 6.8 10*3/uL (ref 4.0–10.5)
nRBC: 0 % (ref 0.0–0.2)

## 2022-01-24 LAB — HCG, SERUM, QUALITATIVE: Preg, Serum: POSITIVE — AB

## 2022-01-24 LAB — LIPASE, BLOOD: Lipase: 26 U/L (ref 11–51)

## 2022-01-24 LAB — HCG, QUANTITATIVE, PREGNANCY: hCG, Beta Chain, Quant, S: 73 m[IU]/mL — ABNORMAL HIGH (ref <5)

## 2022-01-24 NOTE — ED Provider Notes (Signed)
[facility] EMERGENCY DEPT Provider Note   CSN: [id number] Arrival date & time: 01/24/22  1401     History  Chief Complaint  Patient presents with   Abdominal Pain    Lauren Wilkinson is a 21 y.o. female.  Patient with history of ectopic pregnancy presents today with lower abdominal pain. States that same has been persistent and ongoing for 5 days.  States that she took 2 home pregnancy test yesterday both of which were positive.  States she had an ectopic pregnancy in May that was treated with misoprostol, is concerned for same.  She states that her pain is in her suprapubic region, it is mild in nature, and does not radiate.  She denies any nausea, vomiting, or diarrhea.  Also denies hematuria or dysuria.  No vaginal bleeding or discharge.  States that her last menstrual cycle was 12/27/2021.  She has not established care with an OB/GYN.  She is G2P0. NO history of abdominal surgeries.  The history is provided by the patient. No language interpreter was used.  Abdominal Pain      Home Medications Prior to Admission medications   Medication Sig Start Date End Date Taking? Authorizing Provider  Cetirizine HCl (ZYRTEC PO) Take by mouth. Patient not taking: Reported on 09/20/2021    [provider]  ciprofloxacin (CILOXAN) 0.3 % ophthalmic solution Place 2 drops into the left eye every 4 (four) hours while awake. Administer 1 drop, every 2 hours, while awake, for 2 days. Then 1 drop, every 4 hours, while awake, for the next 5 days. Patient not taking: Reported on 09/20/2021 07/09/21   Azucena Cecil, PA-C  FLUoxetine (PROZAC) 20 MG capsule Take 1 capsule (20 mg total) by mouth daily. 10/02/19 12/31/19  Deloria Lair, NP  fluticasone (FLONASE) 50 MCG/ACT nasal spray Place 2 sprays into both nostrils daily. Patient not taking: Reported on 09/20/2021 12/24/16   Waynetta Pean, PA-C  lidocaine (XYLOCAINE) 2 % solution Use as directed 15 mLs in the mouth or throat  every 8 (eight) hours as needed for mouth pain. Patient not taking: Reported on 09/20/2021 05/29/21   Henderly, Britni A, PA-C  Montelukast Sodium (SINGULAIR PO) Take by mouth. Patient not taking: Reported on 09/20/2021    [provider]      Allergies    Patient has no known allergies.    Review of Systems   Review of Systems  Genitourinary:  Positive for pelvic pain.  All other systems reviewed and are negative.   Physical Exam Updated Vital Signs BP 126/83 (BP Location: Right Arm)   Pulse (!) 114   Temp 98.3 F (36.8 C) (Oral)   Resp 18   Ht 5\' 4"  (1.626 m)   Wt 81.2 kg   LMP 01/04/2022 (Approximate)   SpO2 100%   BMI 30.73 kg/m  Physical Exam Vitals and nursing note reviewed.  Constitutional:      General: She is not in acute distress.    Appearance: Normal appearance. She is normal weight. She is not ill-appearing, toxic-appearing or diaphoretic.  HENT:     Head: Normocephalic and atraumatic.  Cardiovascular:     Rate and Rhythm: Normal rate.  Pulmonary:     Effort: Pulmonary effort is normal. No respiratory distress.  Abdominal:     General: Abdomen is flat.     Palpations: Abdomen is soft.     Tenderness: There is no abdominal tenderness. There is no right CVA tenderness or left CVA tenderness.  Musculoskeletal:  General: Normal range of motion.     Cervical back: Normal range of motion.  Skin:    General: Skin is warm and dry.  Neurological:     General: No focal deficit present.     Mental Status: She is alert.  Psychiatric:        Mood and Affect: Mood normal.        Behavior: Behavior normal.     ED Results / Procedures / Treatments   Labs (all labs ordered are listed, but only abnormal results are displayed) Labs Reviewed  COMPREHENSIVE METABOLIC PANEL - Abnormal; Notable for the following components:      Result Value   Potassium 3.4 (*)    Glucose, Bld 107 (*)    AST 11 (*)    All other components within normal limits   URINALYSIS, ROUTINE W REFLEX MICROSCOPIC - Abnormal; Notable for the following components:   Specific Gravity, Urine 1.035 (*)    Protein, ur 30 (*)    Bacteria, UA RARE (*)    All other components within normal limits  HCG, SERUM, QUALITATIVE - Abnormal; Notable for the following components:   Preg, Serum POSITIVE (*)    All other components within normal limits  HCG, QUANTITATIVE, PREGNANCY - Abnormal; Notable for the following components:   hCG, Beta Chain, Quant, S 73 (*)    All other components within normal limits  LIPASE, BLOOD  CBC  ABO/RH    EKG None  Radiology US OB LESS THAN 14 WEEKS WITH OB TRANSVAGINAL  Result Date: 01/24/2022 CLINICAL DATA:  Five day history of lower abdominal/pelvic pain EXAM: OBSTETRIC <14 WK [place] AND TRANSVAGINAL OB US TECHNIQUE: Both transabdominal and transvaginal ultrasound examinations were performed for complete evaluation of the gestation as well as the maternal uterus, adnexal regions, and pelvic cul-de-sac. Transvaginal technique was performed to assess early pregnancy. COMPARISON:  Obstetric ultrasound dated 09/27/2021, 09/13/2021 FINDINGS: Intrauterine gestational sac: None Yolk sac:  Not Visualized. Embryo:  Not Visualized. Cardiac Activity: Not Visualized. Heart Rate: N/A MSD: N/A CRL: N/A Subchorionic hemorrhage:  None visualized. Maternal uterus/adnexae: Retroverted uterus measures 7.0 cm in sagittal dimension. The endometrium measures 10 mm. No intrauterine fluid collection. Normal right ovary measuring 4.0 x 2.8 x 2.2 cm contains a corpus luteum cyst. Left ovary measures 3.7 x 2.4 x 1.4 cm. The area of prior tubal ectopic in the left adnexa has decreased in size measuring 1.6 x 1.3 x 1.1 cm, previously 2.2 x 2.1 x 2.1 cm. Small volume free fluid contains internal septations, similar to 09/27/2021. IMPRESSION: 1. Pregnancy of unknown location. Differential may include early intrauterine pregnancy, nonvisualized ectopic pregnancy, or completed  spontaneous abortion. Consider follow-up beta HCG and ultrasound examination. 2. Decreased size of prior left tubal ectopic pregnancy. 3. Persistent small volume complex free fluid. Electronically Signed   By: Agustin Cree M.D.   On: 01/24/2022 16:35    Procedures Procedures    Medications Ordered in ED Medications - No data to display  ED Course/ Medical Decision Making/ A&P                           Medical Decision Making Amount and/or Complexity of Data Reviewed Labs: ordered. Radiology: ordered.   This patient presents to the ED for concern of pelvic pain in pregnancy, this involves an extensive number of treatment options, and is a complaint that carries with it a high risk of complications and morbidity.  Co morbidities that complicate the patient evaluation  Hx ectopic pregnancy   Additional history obtained:  Additional history obtained from epic chart review   Lab Tests:  I Ordered, and personally interpreted labs.  The pertinent results include:  hcg 73   Imaging Studies ordered:  I ordered imaging studies including pelvic US  I independently visualized and interpreted imaging which showed  1. Pregnancy of unknown location. Differential may include early intrauterine pregnancy, nonvisualized ectopic pregnancy, or completed spontaneous abortion. Consider follow-up beta HCG and ultrasound examination. 2. Decreased size of prior left tubal ectopic pregnancy. 3. Persistent small volume complex free fluid. I agree with the radiologist interpretation   Test / Admission - Considered:  Patient presents today with complaints of pelvic pain.  She is afebrile, nontoxic-appearing, and in no acute distress with reassuring vital signs.  Physical exam reveals abdomen that is soft and nontender.  Patient states that she is currently pain-free.  Patient is hCG is 7 and ultrasound does not reveal gestational sac which is consistent with an hcg this low.  Given this, no  further emergent concerns.  Patient will need recheck hCG in 2 days trend readings.  I have informed her of this and emphasized the importance of establishing care with an OB/GYN.  Patient is understanding and amenable.  She is stable for discharge, educated on red flag symptoms of prompt immediate return.  Patient discharged in stable condition.  Findings and plan of care discussed with supervising physician Dr. Elpidio Anis who is in agreement.     Final Clinical Impression(s) / ED Diagnoses Final diagnoses:  Abdominal pain during pregnancy in first trimester    Rx / DC Orders ED Discharge Orders     None     An After Visit Summary was printed and given to the patient.     Vear Clock 01/24/22 1726    Mardene Sayer, MD 01/25/22 [phone]

## 2022-01-24 NOTE — Discharge Instructions (Signed)
As we discussed, your pregnancy test was positive.  Unfortunately given that you are so early still in your pregnancy, the fetus was not seen on ultrasound.  Given this, you will need to follow-up with an OB/GYN within the next week to trend your lab findings and ensure that the fetus is in the correct place.  I recommend that you call in the morning and schedule an appointment.  Return if development of any new or worsening symptoms.

## 2022-01-24 NOTE — ED Triage Notes (Signed)
Pt c/o lower abdominal pain discomfort for the past five days. Denies any vaginal bleeding or urinary symptoms. Pt states she had a positive pregnancy test at home yesterday. LMP 12/27/21

## 2022-01-25 LAB — ABO/RH: ABO/RH(D): B POS

## 2022-01-29 ENCOUNTER — Inpatient Hospital Stay (HOSPITAL_COMMUNITY)
Admission: AD | Admit: 2022-01-29 | Discharge: 2022-01-29 | Disposition: A | Discharge status: Home / Self Care | Payer: Medicaid Other | Attending: Obstetrics & Gynecology | Admitting: Obstetrics & Gynecology

## 2022-01-29 ENCOUNTER — Inpatient Hospital Stay (HOSPITAL_COMMUNITY): Payer: Medicaid Other

## 2022-01-29 ENCOUNTER — Encounter (HOSPITAL_COMMUNITY): Payer: Self-pay | Admitting: *Deleted

## 2022-01-29 DIAGNOSIS — Z3A01 Less than 8 weeks gestation of pregnancy: Secondary | ICD-10-CM

## 2022-01-29 DIAGNOSIS — R102 Pelvic and perineal pain: Secondary | ICD-10-CM | POA: Diagnosis not present

## 2022-01-29 DIAGNOSIS — O26891 Other specified pregnancy related conditions, first trimester: Secondary | ICD-10-CM | POA: Insufficient documentation

## 2022-01-29 DIAGNOSIS — O09291 Supervision of pregnancy with other poor reproductive or obstetric history, first trimester: Secondary | ICD-10-CM | POA: Insufficient documentation

## 2022-01-29 DIAGNOSIS — O3680X0 Pregnancy with inconclusive fetal viability, not applicable or unspecified: Secondary | ICD-10-CM | POA: Diagnosis not present

## 2022-01-29 HISTORY — DX: Depression, unspecified: F32.A

## 2022-01-29 HISTORY — DX: Urinary tract infection, site not specified: N39.0

## 2022-01-29 HISTORY — DX: Anxiety disorder, unspecified: F41.9

## 2022-01-29 LAB — HCG, QUANTITATIVE, PREGNANCY: hCG, Beta Chain, Quant, S: 923 m[IU]/mL — ABNORMAL HIGH (ref <5)

## 2022-01-29 NOTE — MAU Provider Note (Signed)
History     CSN: [id number]  Arrival date and time: 01/29/22 1631   Event Date/Time   First Provider Initiated Contact with Patient 01/29/22 1729      No chief complaint on file.  HPI This is a 21 year old G2 P0-0-1-0 and approximately 4 weeks and 5 days with a history of an ectopic pregnancy treated with methotrexate earlier this year.  She presents with abdominal pain is in the lower pelvis and feels similar to her previous ectopic pregnancy.  It is nonradiating with a mild constant in nature to gets worse with laughing and coughing.  No bleeding.  OB History     Gravida  2   Para      Term      Preterm      AB  1   Living  0      SAB      IAB      Ectopic  1   Multiple      Live Births  0           Past Medical History:  Diagnosis Date   Anxiety    Asthma    as child   Depression    Scoliosis    UTI (urinary tract infection)     Past Surgical History:  Procedure Laterality Date   NO PAST SURGERIES      Family History  Problem Relation Age of Onset   Hypertension Mother    Hypertension Father     Social History   Tobacco Use   Smoking status: Never   Smokeless tobacco: Never   Tobacco comments:    Sometimes vape  Vaping Use   Vaping Use: Some days  Substance Use Topics   Alcohol use: No   Drug use: No    Allergies: No Known Allergies  Medications Prior to Admission  Medication Sig Dispense Refill Last Dose   Cetirizine HCl (ZYRTEC PO) Take by mouth. (Patient not taking: Reported on 09/20/2021)      ciprofloxacin (CILOXAN) 0.3 % ophthalmic solution Place 2 drops into the left eye every 4 (four) hours while awake. Administer 1 drop, every 2 hours, while awake, for 2 days. Then 1 drop, every 4 hours, while awake, for the next 5 days. (Patient not taking: Reported on 09/20/2021) 5 mL 0    FLUoxetine (PROZAC) 20 MG capsule Take 1 capsule (20 mg total) by mouth daily. 30 capsule 2    fluticasone (FLONASE) 50 MCG/ACT nasal spray Place  2 sprays into both nostrils daily. (Patient not taking: Reported on 09/20/2021) 16 g 0    lidocaine (XYLOCAINE) 2 % solution Use as directed 15 mLs in the mouth or throat every 8 (eight) hours as needed for mouth pain. (Patient not taking: Reported on 09/20/2021) 150 mL 0    Montelukast Sodium (SINGULAIR PO) Take by mouth. (Patient not taking: Reported on 09/20/2021)       Review of Systems Physical Exam   Blood pressure 133/86, pulse (!) 113, temperature 99.3 F (37.4 C), temperature source Oral, resp. rate 16, height 5' 4.5" (1.638 m), weight 82.6 kg, last menstrual period 12/27/2021, SpO2 100 %, unknown if currently breastfeeding.  Physical Exam Vitals and nursing note reviewed.  Constitutional:      Appearance: Normal appearance.  Cardiovascular:     Rate and Rhythm: Normal rate and regular rhythm.  Pulmonary:     Effort: Pulmonary effort is normal.  Abdominal:     General: Abdomen is flat. There is  no distension.     Palpations: Abdomen is soft. There is no mass.     Tenderness: There is abdominal tenderness (lower pelvic tenderness). There is no guarding or rebound.  Skin:    General: Skin is warm and dry.     Capillary Refill: Capillary refill takes less than 2 seconds.  Neurological:     General: No focal deficit present.     Mental Status: She is alert.  Psychiatric:        Mood and Affect: Mood normal.        Behavior: Behavior normal.        Thought Content: Thought content normal.        Judgment: Judgment normal.    US OB Transvaginal  Result Date: 01/29/2022 CLINICAL DATA:  Estimated gestational age by last menstrual period equals 4 weeks 5 days. Cramping abdominal pain. EXAM: TRANSVAGINAL OB ULTRASOUND TECHNIQUE: Transvaginal ultrasound was performed for complete evaluation of the gestation as well as the maternal uterus, adnexal regions, and pelvic cul-de-sac. COMPARISON:  None Available. FINDINGS: Intrauterine gestational sac: Single small sac-like structure  within the endometrial canal towards the fundus. Yolk sac:  Not identified Embryo:  not identified MSD: 2.8 mm   5 w   0 d Subchorionic hemorrhage:  None visualized. Maternal uterus/adnexae: Normal ovaries.  No free fluid IMPRESSION: Probable early intrauterine gestational sac, but no yolk sac, fetal pole, or cardiac activity yet visualized. Recommend follow-up quantitative B-HCG levels and follow-up US in 14 days to assess viability. This recommendation follows SRU consensus guidelines: Diagnostic Criteria for Nonviable Pregnancy Early in the First Trimester. Malva Limes Med 2013; 425:9563-87. Estimated gestational age by mean sac diameter equals 5 weeks 0 days. Electronically Signed   By: Genevive Bi M.D.   On: 01/29/2022 19:01    Results for orders placed or performed during the hospital encounter of 01/29/22 (from the past 24 hour(s))  hCG, quantitative, pregnancy     Status: Abnormal   Collection Time: 01/29/22  5:36 PM  Result Value Ref Range   hCG, Beta Chain, Quant, S 923 (H) <5 mIU/mL    MAU Course  Procedures  MDM Imaging: I independent reviewed the images of the ultrasound. [redacted]w[redacted]d intrauterine gestation sac. No yolk sac or fetal pole. No adnexal mass.   Assessment and Plan   1. [redacted] weeks gestation of pregnancy   2. Pregnancy with uncertain fetal viability, single or unspecified fetus    2 week follow up US.  Bleeding precautions given.  Levie Heritage 01/29/2022, 5:31 PM

## 2022-01-29 NOTE — MAU Note (Signed)
Lauren Wilkinson is a 21 y.o. at [redacted]w[redacted]d here in MAU reporting: basically went to the hospital, hx of ectopic in June, received MTX. Has been having cramping, that is what made her do the test, hadn't missed period yet. Pressure in lower abd. Was told to follow up, due to hx. LMP: 9/20 Onset of complaint: wk ago Pain score: 6, 8 when she laughs, mild when just sitting Vitals:   01/29/22 1709  BP: 133/86  Pulse: (!) 113  Resp: 16  Temp: 99.3 F (37.4 C)  SpO2: 100%      Lab orders placed from triage:

## 2022-02-09 ENCOUNTER — Other Ambulatory Visit: Payer: Self-pay

## 2022-02-09 ENCOUNTER — Emergency Department (HOSPITAL_BASED_OUTPATIENT_CLINIC_OR_DEPARTMENT_OTHER): Payer: Medicaid Other

## 2022-02-09 ENCOUNTER — Encounter (HOSPITAL_BASED_OUTPATIENT_CLINIC_OR_DEPARTMENT_OTHER): Payer: Self-pay

## 2022-02-09 ENCOUNTER — Emergency Department (HOSPITAL_BASED_OUTPATIENT_CLINIC_OR_DEPARTMENT_OTHER)
Admission: EM | Admit: 2022-02-09 | Discharge: 2022-02-09 | Disposition: A | Discharge status: Home / Self Care | Payer: Medicaid Other | Attending: Emergency Medicine | Admitting: Emergency Medicine

## 2022-02-09 DIAGNOSIS — K5900 Constipation, unspecified: Secondary | ICD-10-CM | POA: Diagnosis not present

## 2022-02-09 DIAGNOSIS — O99611 Diseases of the digestive system complicating pregnancy, first trimester: Secondary | ICD-10-CM | POA: Insufficient documentation

## 2022-02-09 DIAGNOSIS — Z3A01 Less than 8 weeks gestation of pregnancy: Secondary | ICD-10-CM | POA: Diagnosis not present

## 2022-02-09 DIAGNOSIS — O26891 Other specified pregnancy related conditions, first trimester: Secondary | ICD-10-CM | POA: Diagnosis present

## 2022-02-09 DIAGNOSIS — R110 Nausea: Secondary | ICD-10-CM | POA: Diagnosis not present

## 2022-02-09 LAB — WET PREP, GENITAL
Clue Cells Wet Prep HPF POC: NONE SEEN
Sperm: NONE SEEN
Trich, Wet Prep: NONE SEEN
WBC, Wet Prep HPF POC: <10 (ref <10)
Yeast Wet Prep HPF POC: NONE SEEN

## 2022-02-09 LAB — HCG, QUANTITATIVE, PREGNANCY: hCG, Beta Chain, Quant, S: 41060 m[IU]/mL — ABNORMAL HIGH (ref <5)

## 2022-02-09 NOTE — ED Notes (Signed)
Patient given coke and popcorn bag as requested. No other concerns or needs at this time.

## 2022-02-09 NOTE — Discharge Instructions (Signed)
Return if any problems.  Your cultures are pending.  Take a dose of miralax daily for the next week

## 2022-02-09 NOTE — ED Notes (Signed)
Patient transported to Ultrasound via wheelchair.

## 2022-02-09 NOTE — ED Triage Notes (Signed)
Patient here POV from Home.  Endorses Constipation for approximately 1 Week. Normally has a BM every 2 Days.  Also endorses not wanting to eat. Mild nausea.   Currently Pregnant. LMP: 12/27/21.   NAD Noted during Triage

## 2022-02-09 NOTE — ED Notes (Signed)
Patient brought back to room at this time, awaiting lab results. No concerns or needs noted. Mother of patient remains at bedside.

## 2022-02-11 NOTE — ED Provider Notes (Signed)
[facility] EMERGENCY DEPT Provider Note   CSN: [id number] Arrival date & time: 02/09/22  1346     History  Chief Complaint  Patient presents with   Constipation    Lauren Wilkinson is a 21 y.o. female.  Patient complains of low or abdominal pain.  She reports she is early pregnant.  Patient reports she feels like she is constipated.  Patient is also concerned about STDs due to vaginal irritation.  Patient was seen on 1023 and had an ultrasound showed a gestational sac.  Patient is supposed to have a follow-up ultrasound next week.  She denies any bleeding she denies any nose patient has been experiencing some nausea  The history is provided by the patient. No language interpreter was used.  Constipation Severity:  Moderate Timing:  Constant Progression:  Worsening Chronicity:  New Stool description:  None produced Relieved by:  Nothing Worsened by:  Nothing Ineffective treatments:  None tried Associated symptoms: no abdominal pain   Risk factors: no change in medication        Home Medications Prior to Admission medications   Medication Sig Start Date End Date Taking? Authorizing Provider  FLUoxetine (PROZAC) 20 MG capsule Take 1 capsule (20 mg total) by mouth daily. 10/02/19 12/31/19  Deloria Lair, NP      Allergies    Patient has no known allergies.    Review of Systems   Review of Systems  Gastrointestinal:  Positive for constipation. Negative for abdominal pain.  All other systems reviewed and are negative.   Physical Exam Updated Vital Signs BP 118/70 (BP Location: Right Arm)   Pulse 97   Temp 98.2 F (36.8 C) (Oral)   Resp 16   Ht 5' 4.5" (1.638 m)   Wt 82.6 kg   LMP 12/27/2021 (Approximate)   SpO2 100%   BMI 30.77 kg/m  Physical Exam Vitals and nursing note reviewed.  Constitutional:      Appearance: She is well-developed.  HENT:     Head: Normocephalic.  Cardiovascular:     Rate and Rhythm: Normal rate.  Pulmonary:      Effort: Pulmonary effort is normal.  Abdominal:     General: There is no distension.  Musculoskeletal:        General: Normal range of motion.     Cervical back: Normal range of motion.  Skin:    General: Skin is warm.  Neurological:     General: No focal deficit present.     Mental Status: She is alert and oriented to person, place, and time.     ED Results / Procedures / Treatments   Labs (all labs ordered are listed, but only abnormal results are displayed) Labs Reviewed  HCG, QUANTITATIVE, PREGNANCY - Abnormal; Notable for the following components:      Result Value   hCG, Beta Chain, Quant, S 41,060 (*)    All other components within normal limits  WET PREP, GENITAL  GC/CHLAMYDIA PROBE AMP ([place]) NOT AT [facility]    EKG None  Radiology No results found.  Procedures Procedures    Medications Ordered in ED Medications - No data to display  ED Course/ Medical Decision Making/ A&P                           Medical Decision Making Pt complains of constipation and early preganncy  Amount and/or Complexity of Data Reviewed External Data Reviewed: labs.    Details: Ephriam Jenkins  and ED visit reviewed Labs: ordered. Decision-making details documented in ED Course.    Details: [place] [address]. Labs ordered reviewed and interpreted Radiology: ordered and independent interpretation performed.    Details: Ultrasound shows 6week 0 day iup with heart rate of 108  Risk Risk Details: Pt advised miralax daily for 1 week.  Pt advised to schedule prenatal care.   [id number]         Final Clinical Impression(s) / ED Diagnoses Final diagnoses:  Less than [redacted] weeks gestation of pregnancy    Rx / DC Orders ED Discharge Orders     None      An After Visit Summary was printed and given to the patient.    Elson Areas, PA-C 02/11/22 1958    Rolan Bucco, MD 02/16/22 [phone]

## 2022-02-12 ENCOUNTER — Ambulatory Visit: Admission: RE | Admit: 2022-02-12 | Payer: Medicaid Other | Source: Ambulatory Visit

## 2022-02-12 ENCOUNTER — Encounter: Payer: Self-pay | Admitting: General Practice

## 2022-02-12 LAB — GC/CHLAMYDIA PROBE AMP (~~LOC~~) NOT AT ARMC
Chlamydia: NEGATIVE
Comment: NEGATIVE
Comment: NORMAL
Neisseria Gonorrhea: NEGATIVE

## 2022-02-26 ENCOUNTER — Telehealth: Payer: Self-pay | Admitting: Family Medicine

## 2022-02-26 NOTE — Telephone Encounter (Signed)
Called patient to assess vomiting. She reports she is vomiting bile and cannot hold food down. She has been vomiting anytime she is trying to eat. She is able to keep some water down. She is voiding once or twice a day. She has not tried any medications. She reports the last time she was able to eat a real meal was in late October. She is eating toast or bread and is not keeping that down now for 2 weeks.   Patient has not been seen in the office for [facility] up to this date, she does have future appointments scheduled. Advised patient to go to the Maternity Assessment Unit at the [facility] for evaluation. Patient given address of where to go and has been there previously. Patient voiced understanding.

## 2022-02-26 NOTE — Telephone Encounter (Signed)
Called pt to discuss her concern and heard message stating that the call could not be completed @ this time and to try again later.

## 2022-02-26 NOTE — Telephone Encounter (Signed)
Patient called in because after hours told her she needed to be seen within 24 hours due to her being unable to keep any food down. She states the vomiting started about a month ago and she is miserable. I told her I would send a message to our nurses to further evaluate what needs to be done about her consistent vomiting.

## 2022-02-27 ENCOUNTER — Other Ambulatory Visit: Payer: Self-pay

## 2022-02-27 ENCOUNTER — Inpatient Hospital Stay (HOSPITAL_COMMUNITY)
Admission: AD | Admit: 2022-02-27 | Discharge: 2022-02-27 | Disposition: A | Discharge status: Home / Self Care | Payer: Medicaid Other | Attending: Obstetrics and Gynecology | Admitting: Obstetrics and Gynecology

## 2022-02-27 DIAGNOSIS — O219 Vomiting of pregnancy, unspecified: Secondary | ICD-10-CM | POA: Insufficient documentation

## 2022-02-27 DIAGNOSIS — Z1152 Encounter for screening for COVID-19: Secondary | ICD-10-CM | POA: Insufficient documentation

## 2022-02-27 DIAGNOSIS — Z3A08 8 weeks gestation of pregnancy: Secondary | ICD-10-CM | POA: Insufficient documentation

## 2022-02-27 LAB — RESP PANEL BY RT-PCR (FLU A&B, COVID) ARPGX2
Influenza A by PCR: NEGATIVE
Influenza B by PCR: NEGATIVE
SARS Coronavirus 2 by RT PCR: NEGATIVE

## 2022-02-27 LAB — URINALYSIS, ROUTINE W REFLEX MICROSCOPIC
Bilirubin Urine: NEGATIVE
Glucose, UA: NEGATIVE mg/dL
Hgb urine dipstick: NEGATIVE
Ketones, ur: 20 mg/dL — AB
Nitrite: NEGATIVE
Protein, ur: 100 mg/dL — AB
Specific Gravity, Urine: 1.019 (ref 1.005–1.030)
pH: 5 (ref 5.0–8.0)

## 2022-02-27 MED ORDER — METOCLOPRAMIDE HCL 10 MG PO TABS
10.0000 mg | ORAL_TABLET | Freq: Once | ORAL | Status: AC
  Administered 2022-02-27: 10 mg via ORAL
  Filled 2022-02-27: qty 1

## 2022-02-27 MED ORDER — METOCLOPRAMIDE HCL 10 MG PO TABS: 10.0000 mg | ORAL_TABLET | Freq: Three times a day (TID) | ORAL | 0 refills | Status: DC | PRN

## 2022-02-27 NOTE — MAU Provider Note (Addendum)
History     CSN: [id number]  Arrival date and time: 02/27/22 1728  Chief Complaint  Patient presents with   Nausea   Emesis   Lauren Wilkinson is a 21 y.o. G2P0010 female at [redacted]w[redacted]d by LMP presenting for evaluation of persistent nausea and vomiting. She has had morning sickness since the end of October where it was difficult to take some solids. However, 5 days ago, she began to have trouble holding down fluids. She vomits about twice per day, and the vomit is yellow in color with food contents. She states she urinates twice per day as well, and her urine is very dark in color. She denies any dysuria. She does endorse fever and chills for which her mom took her temperature 3 days ago and revealed temp of 101. 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 She denies any respiratory symptoms.  OB History     Gravida  2   Para      Term      Preterm      AB  1   Living  0      SAB      IAB      Ectopic  1   Multiple      Live Births  0           Past Medical History:  Diagnosis Date   Anxiety    Asthma    as child   Depression    Scoliosis    UTI (urinary tract infection)     Past Surgical History:  Procedure Laterality Date   NO PAST SURGERIES      Family History  Problem Relation Age of Onset   Hypertension Mother    Hypertension Father     Social History   Tobacco Use   Smoking status: Never   Smokeless tobacco: Never   Tobacco comments:    Sometimes vape  Vaping Use   Vaping Use: Some days  Substance Use Topics   Alcohol use: No   Drug use: No   Allergies: No Known Allergies  Medications Prior to Admission  Medication Sig Dispense Refill Last Dose   FLUoxetine (PROZAC) 20 MG capsule Take 1 capsule (20 mg total) by mouth daily. 30 capsule 2    ROS per HPI.  Physical Exam   Blood pressure 128/81, pulse (!) 103, temperature 98 F (36.7 C), temperature source Oral, resp.  rate 19, height 5' 4.5" (1.638 m), weight 77.7 kg, last menstrual period 12/27/2021, SpO2 100 %, unknown if currently breastfeeding.  Physical Exam Constitutional:      General: She is not in acute distress.    Appearance: Normal appearance. She is not ill-appearing.  HENT:     Head: Normocephalic and atraumatic.     Mouth/Throat:     Mouth: Mucous membranes are moist.  Eyes:     Extraocular Movements: Extraocular movements intact.  Cardiovascular:     Rate and Rhythm: Regular rhythm. Tachycardia present.     Heart sounds: Normal heart sounds.  Pulmonary:     Effort: Pulmonary effort is normal.     Breath sounds: Normal breath sounds.  Abdominal:     General: Abdomen is flat. Bowel sounds are normal.     Palpations: Abdomen is soft.     Tenderness: There is no abdominal tenderness.  Musculoskeletal:        General: Normal range of motion.  Skin:  General: Skin is warm and dry.     Capillary Refill: Capillary refill takes less than 2 seconds.     Comments: Good skin turgor.  Neurological:     General: No focal deficit present.     Mental Status: She is alert.  Psychiatric:        Mood and Affect: Mood is anxious.    MAU Course   MDM UA - amber color with 20 ketones and 100 protein, trace leuks and few bacteria Resp panel - pending Reglan 10 mg once  Assessment and Plan  Lauren Wilkinson is a 21 y.o. G2P0010 female at 107w6d by LMP presenting for evaluation of persistent nausea and vomiting.   I suspect her symptoms are secondary to her pregnancy given unremarkable physical exam and overall benign history. However, her weight has decreased by 6% in 2-3 weeks with evidence of mild dehydration on UA with ketonuria, which could be evolving hyperemesis gravidarum. In light of her recent exposure at work, her symptoms could also be explained by COVID given predominance of GI symptoms in recent strains.  She is stable for discharge given her stability, though she needs close  follow up to ensure she is able to take more PO. Will send PO reglan 10 mg to be taken as needed for nausea.  Janeal Holmes, MD 02/27/2022, 7:02 PM    Attestation of Supervision of Student:  I confirm that I have verified the information documented in the resident's note. I have verified that all services and findings are accurately documented in this student's note; and I agree with management and plan as outlined in the documentation. I have also made any necessary editorial changes.  Patient will be prescribed reglan for management of her nausea.   Pt informed that the ultrasound is considered a limited OB ultrasound and is not intended to be a complete ultrasound exam.  Patient also informed that the ultrasound is not being completed with the intent of assessing for fetal or placental anomalies or any pelvic abnormalities.  Explained that the purpose of today's ultrasound is to assess for   viability per patient's request .  Patient acknowledges the purpose of the exam and the limitations of the study.   Live IUP with FHR 167 bpm   Judeth Horn, NP [facility] [employer] 02/27/2022 9:22 PM

## 2022-02-27 NOTE — MAU Note (Signed)
Lauren Wilkinson is a 21 y.o. at [redacted]w[redacted]d here in MAU reporting: she has N/V and can't keep anything down.  States she's only voiding twice per day and thinks she's "dehydrated".  Reports doesn't currently have any meds to take.   LMP: NA Onset of complaint: 1.5 weeks Pain score: 0 Vitals:   02/27/22 1800  BP: 128/81  Pulse: (!) 103  Resp: 19  Temp: 98 F (36.7 C)  SpO2: 100%     FHT:NA Lab orders placed from triage:   UA

## 2022-02-27 NOTE — MAU Note (Signed)
Judeth Horn NP in Triage to see pt

## 2022-02-27 NOTE — Progress Notes (Signed)
Written and verbal d/c instructions given and understanding voiced. 

## 2022-02-27 NOTE — Discharge Instructions (Signed)

## 2022-02-27 NOTE — Progress Notes (Signed)
Written and verbal d/c instructions given and understanding voiced. Pt has work note to return to work Fri per Judeth Horn NP

## 2022-03-03 ENCOUNTER — Inpatient Hospital Stay (HOSPITAL_COMMUNITY)
Admission: AD | Admit: 2022-03-03 | Discharge: 2022-03-04 | Disposition: A | Discharge status: Home / Self Care | Payer: Medicaid Other | Attending: Obstetrics and Gynecology | Admitting: Obstetrics and Gynecology

## 2022-03-03 ENCOUNTER — Encounter (HOSPITAL_COMMUNITY): Payer: Self-pay | Admitting: Obstetrics and Gynecology

## 2022-03-03 DIAGNOSIS — Z79899 Other long term (current) drug therapy: Secondary | ICD-10-CM | POA: Insufficient documentation

## 2022-03-03 DIAGNOSIS — O99331 Smoking (tobacco) complicating pregnancy, first trimester: Secondary | ICD-10-CM | POA: Diagnosis not present

## 2022-03-03 DIAGNOSIS — O219 Vomiting of pregnancy, unspecified: Secondary | ICD-10-CM

## 2022-03-03 DIAGNOSIS — O99511 Diseases of the respiratory system complicating pregnancy, first trimester: Secondary | ICD-10-CM | POA: Diagnosis present

## 2022-03-03 DIAGNOSIS — Z3A09 9 weeks gestation of pregnancy: Secondary | ICD-10-CM | POA: Diagnosis not present

## 2022-03-03 LAB — COMPREHENSIVE METABOLIC PANEL
ALT: 17 U/L (ref 0–44)
AST: 18 U/L (ref 15–41)
Albumin: 3.7 g/dL (ref 3.5–5.0)
Alkaline Phosphatase: 49 U/L (ref 38–126)
Anion gap: 12 (ref 5–15)
BUN: <5 mg/dL — ABNORMAL LOW (ref 6–20)
CO2: 22 mmol/L (ref 22–32)
Calcium: 9.5 mg/dL (ref 8.9–10.3)
Chloride: 100 mmol/L (ref 98–111)
Creatinine, Ser: 0.72 mg/dL (ref 0.44–1.00)
GFR, Estimated: >60 mL/min (ref >60)
Glucose, Bld: 78 mg/dL (ref 70–99)
Potassium: 3.3 mmol/L — ABNORMAL LOW (ref 3.5–5.1)
Sodium: 134 mmol/L — ABNORMAL LOW (ref 135–145)
Total Bilirubin: 0.4 mg/dL (ref 0.3–1.2)
Total Protein: 7.4 g/dL (ref 6.5–8.1)

## 2022-03-03 LAB — URINALYSIS, ROUTINE W REFLEX MICROSCOPIC
Bilirubin Urine: NEGATIVE
Glucose, UA: NEGATIVE mg/dL
Hgb urine dipstick: NEGATIVE
Ketones, ur: 80 mg/dL — AB
Nitrite: NEGATIVE
Protein, ur: 30 mg/dL — AB
Specific Gravity, Urine: 1.024 (ref 1.005–1.030)
Squamous Epithelial / HPF: >50 — ABNORMAL HIGH (ref 0–5)
pH: 5 (ref 5.0–8.0)

## 2022-03-03 MED ORDER — LACTATED RINGERS IV BOLUS
1000.0000 mL | Freq: Once | INTRAVENOUS | Status: AC
  Administered 2022-03-03: 1000 mL via INTRAVENOUS

## 2022-03-03 MED ORDER — SCOPOLAMINE 1 MG/3DAYS TD PT72
1.0000 | MEDICATED_PATCH | Freq: Once | TRANSDERMAL | Status: DC
  Administered 2022-03-03: 1.5 mg via TRANSDERMAL
  Filled 2022-03-03: qty 1

## 2022-03-03 MED ORDER — SODIUM CHLORIDE 0.9 % IV SOLN
25.0000 mg | Freq: Once | INTRAVENOUS | Status: AC
  Administered 2022-03-03: 25 mg via INTRAVENOUS
  Filled 2022-03-03: qty 1

## 2022-03-03 MED ORDER — PROMETHAZINE HCL 12.5 MG PO TABS: 12.5000 mg | ORAL_TABLET | Freq: Every evening | ORAL | 0 refills | Status: DC | PRN

## 2022-03-03 MED ORDER — THIAMINE HCL 100 MG/ML IJ SOLN
Freq: Once | INTRAVENOUS | Status: AC
  Filled 2022-03-03: qty 1000

## 2022-03-03 MED ORDER — SCOPOLAMINE 1 MG/3DAYS TD PT72: 1.0000 | MEDICATED_PATCH | TRANSDERMAL | 3 refills | Status: DC

## 2022-03-03 MED ORDER — ONDANSETRON 4 MG PO TBDP: 4.0000 mg | ORAL_TABLET | Freq: Three times a day (TID) | ORAL | 0 refills | Status: DC | PRN

## 2022-03-03 NOTE — MAU Provider Note (Signed)
History     CSN: [id number]  Arrival date and time: 03/03/22 1806   Event Date/Time   First Provider Initiated Contact with Patient 03/03/22 2010      Chief Complaint  Patient presents with   Emesis   Lauren Wilkinson , a  21 y.o. G2P0010 at [redacted]w[redacted]d presents to MAU with complaints of nausea and vomiting for the last 3 weeks. Patient states she was taking Reglan, and she threw it up so stopped taking it. Last dose 2 days ago. Patient endorses attempting to eat some crackers with ginger ale but unsuccessful. She endorses 2 episodes of vomiting today. Patient states she's only urinating 1-2 times per day and endorses dark brow urine. Patient states last BM was 3 weeks ago because she has not been able to eat. Endorses some mild abdominal discomfort but believes its not from eating. Patient and patients mother are very concerned about her weight loss over the last 4 weeks.   Initial/ Prepregnancy weight was 82 kg (182lbs) on 01/29/2022. Patient was recently seen in MAU with similar complaints. 5 days ago she weighed 77kg (171 lbs) and then today current weight 75 kg (165lbs) for a total weight loss of 17lbs in 4 weeks.          OB History     Gravida  2   Para      Term      Preterm      AB  1   Living  0      SAB      IAB      Ectopic  1   Multiple      Live Births  0           Past Medical History:  Diagnosis Date   Anxiety    Asthma    as child   Depression    Scoliosis    UTI (urinary tract infection)     Past Surgical History:  Procedure Laterality Date   NO PAST SURGERIES      Family History  Problem Relation Age of Onset   Hypertension Mother    Hypertension Father     Social History   Tobacco Use   Smoking status: Never   Smokeless tobacco: Never   Tobacco comments:    Sometimes vape  Vaping Use   Vaping Use: Some days  Substance Use Topics   Alcohol use: No   Drug use: No    Allergies: No Known Allergies  Medications Prior  to Admission  Medication Sig Dispense Refill Last Dose   metoCLOPramide (REGLAN) 10 MG tablet Take 1 tablet (10 mg total) by mouth every 8 (eight) hours as needed. 30 tablet 0     Review of Systems  Constitutional:  Positive for appetite change and unexpected weight change. Negative for chills, fatigue and fever.  Eyes:  Negative for pain and visual disturbance.  Respiratory:  Negative for apnea, shortness of breath and wheezing.   Cardiovascular:  Negative for chest pain and palpitations.  Gastrointestinal:  Positive for nausea and vomiting. Negative for abdominal pain, constipation and diarrhea.  Genitourinary:  Negative for difficulty urinating, dysuria, pelvic pain, vaginal bleeding, vaginal discharge and vaginal pain.  Musculoskeletal:  Negative for back pain.  Neurological:  Positive for dizziness and light-headedness. Negative for seizures, weakness and headaches.  Psychiatric/Behavioral:  Negative for suicidal ideas.     Physical Exam   Blood pressure 130/76, pulse 92, temperature 98.3 F (36.8 C), resp. rate 18, weight  75.8 kg, last menstrual period 12/27/2021, unknown if currently breastfeeding.  Physical Exam Vitals and nursing note reviewed.  Constitutional:      General: She is not in acute distress.    Appearance: Normal appearance.  HENT:     Head: Normocephalic.  Cardiovascular:     Rate and Rhythm: Normal rate.  Pulmonary:     Effort: Pulmonary effort is normal.  Abdominal:     Palpations: Abdomen is soft.  Musculoskeletal:     Cervical back: Normal range of motion.  Skin:    General: Skin is warm and dry.     Capillary Refill: Capillary refill takes 2 to 3 seconds.  Neurological:     Mental Status: She is alert and oriented to person, place, and time.  Psychiatric:        Mood and Affect: Mood normal.     FHT obtained in triage   MAU Course  Procedures Orders Placed This Encounter  Procedures   Urinalysis, Routine w reflex microscopic Urine, Clean  Catch   Comprehensive metabolic panel   Discharge patient   Meds ordered this encounter  Medications   lactated ringers bolus 1,000 mL   promethazine (PHENERGAN) 25 mg in sodium chloride 0.9 % 50 mL IVPB   DISCONTD: scopolamine (TRANSDERM-SCOP) 1 MG/3DAYS 1.5 mg   sodium chloride 0.9 % 1,000 mL with thiamine 100 mg, folic acid 1 mg, M.V.I. Adult 10 mL infusion   ondansetron (ZOFRAN-ODT) 4 MG disintegrating tablet    Sig: Take 1 tablet (4 mg total) by mouth every 8 (eight) hours as needed for nausea or vomiting.    Dispense:  15 tablet    Refill:  0    Order Specific Question:   Supervising Provider    Answer:   Reva Bores [[id number]]   promethazine (PHENERGAN) 12.5 MG tablet    Sig: Take 1 tablet (12.5 mg total) by mouth at bedtime as needed for nausea or vomiting.    Dispense:  30 tablet    Refill:  0    Order Specific Question:   Supervising Provider    Answer:   Reva Bores [[id number]]   scopolamine (TRANSDERM-SCOP) 1 MG/3DAYS    Sig: Place 1 patch (1.5 mg total) onto the skin every 3 (three) days.    Dispense:  10 patch    Refill:  3    Order Specific Question:   Supervising Provider    Answer:   Reva Bores [[id number]]    Results for orders placed or performed during the hospital encounter of 03/03/22 (from the past 24 hour(s))  Urinalysis, Routine w reflex microscopic Urine, Clean Catch     Status: Abnormal   Collection Time: 03/03/22  6:12 PM  Result Value Ref Range   Color, Urine AMBER (A) YELLOW   APPearance CLOUDY (A) CLEAR   Specific Gravity, Urine 1.024 1.005 - 1.030   pH 5.0 5.0 - 8.0   Glucose, UA NEGATIVE NEGATIVE mg/dL   Hgb urine dipstick NEGATIVE NEGATIVE   Bilirubin Urine NEGATIVE NEGATIVE   Ketones, ur 80 (A) NEGATIVE mg/dL   Protein, ur 30 (A) NEGATIVE mg/dL   Nitrite NEGATIVE NEGATIVE   Leukocytes,Ua TRACE (A) NEGATIVE   RBC / HPF 0-5 0 - 5 RBC/hpf   WBC, UA 21-50 0 - 5 WBC/hpf   Bacteria, UA FEW (A) NONE SEEN   Squamous Epithelial / LPF >50 (H) 0 - 5    Mucus PRESENT   Comprehensive metabolic panel  Status: Abnormal   Collection Time: 03/03/22  8:42 PM  Result Value Ref Range   Sodium 134 (L) 135 - 145 mmol/L   Potassium 3.3 (L) 3.5 - 5.1 mmol/L   Chloride 100 98 - 111 mmol/L   CO2 22 22 - 32 mmol/L   Glucose, Bld 78 70 - 99 mg/dL   BUN <5 (L) 6 - 20 mg/dL   Creatinine, Ser 0.72 0.44 - 1.00 mg/dL   Calcium 9.5 8.9 - 10.3 mg/dL   Total Protein 7.4 6.5 - 8.1 g/dL   Albumin 3.7 3.5 - 5.0 g/dL   AST 18 15 - 41 U/L   ALT 17 0 - 44 U/L   Alkaline Phosphatase 49 38 - 126 U/L   Total Bilirubin 0.4 0.3 - 1.2 mg/dL   GFR, Estimated >60 >60 mL/min   Anion gap 12 5 - 15     Lab results reviewed and interpreted by me   MDM - UA reflexed to urine culture  - UA positive for 80 of Ketones, IV fluids ordered.  - Patient with greater than 6% weight loss since [id number] and mild metabolic changes,  - PUQE score of 7+ moderate nausea and vomiting. Suspicion for evolving HG - Vitals stable  - IV banana bag ordered.  - Nausea and vomiting improved with IV fluids, banana bag, scop patch, iv phenergan.  - PO Challenge successful. banana bag being given, patient desires to leave at completion of 1/2 banana bag. CNM reiterated the importance of receiving the whole bag. Patient still desires to leave prior to completion.  - Plan for discharge   Assessment and Plan   1. Nausea and vomiting in pregnancy prior to [redacted] weeks gestation   2. [redacted] weeks gestation of pregnancy   - Reviewed nausea and vomiting in pregnancy. Discussed worsening signs and strict return precautions.  - Discussed attempting to get patient on a daily nausea regimen including ODT Zofran and sea bands, and phenergan at night. Also discussed with patient the importance of taking medication daily as prescribed despite continued nausea and vomiting with alternative routes for Zofran.   - Rx for phenergan and Zofran sent to outpatient pharmacy for pickup. Reviewed that Zofran make cause  constipation if taken often and that it may be useful to incorporate a stool softener into regimen.  - Printed Rx for scop patches provided.  - Also discussed alternatives like ginger chews and OTC Diclegis if needed.  - Patient discharged home In stable condition and may return to MAU as needed.   Lauren Doe, MSN CNM  03/03/2022, 8:10 PM

## 2022-03-03 NOTE — MAU Note (Signed)
.  Lauren Wilkinson is a 21 y.o. at [redacted]w[redacted]d here in MAU reporting: increased n/v. Prescribed reglan and when she took it 3 days ago she threw it back up. Not tried to take it since. Any time she tries to eat she vomits. Feels weak like she is going to pass out. Reprots she ahs lost about 20 pounds in 3 weeks.  Weight on  10/23 82.5 kg on 11/21 77.7 kg and today is 75.75kg  Onset of complaint: 3 weeks Pain score: 0 Vitals:   03/03/22 1847  BP: 130/73  Pulse: 99  Resp: 18  Temp: 98.3 F (36.8 C)     FHT:n/a Lab orders placed from triage:  u/a

## 2022-03-21 ENCOUNTER — Telehealth (INDEPENDENT_AMBULATORY_CARE_PROVIDER_SITE_OTHER): Payer: Medicaid Other | Admitting: *Deleted

## 2022-03-21 ENCOUNTER — Encounter: Payer: Self-pay | Admitting: *Deleted

## 2022-03-21 DIAGNOSIS — Z3490 Encounter for supervision of normal pregnancy, unspecified, unspecified trimester: Secondary | ICD-10-CM

## 2022-03-21 DIAGNOSIS — O99210 Obesity complicating pregnancy, unspecified trimester: Secondary | ICD-10-CM

## 2022-03-21 DIAGNOSIS — Z3201 Encounter for pregnancy test, result positive: Secondary | ICD-10-CM

## 2022-03-21 DIAGNOSIS — Z3689 Encounter for other specified antenatal screening: Secondary | ICD-10-CM

## 2022-03-21 MED ORDER — PRENATAL VITAMIN 27-0.8 MG PO TABS: 1.0000 | ORAL_TABLET | Freq: Every day | ORAL | 11 refills | Status: AC

## 2022-03-21 NOTE — Patient Instructions (Signed)
Here is a link to the Pregnancy Navigators . Please Fill out prior to your New OB appointment.   English Link: [email or web address]  Spanish Link: [email or web address]   [email or web address] is a website with information to help you prepare for Labor and delivery, patient information, visitor information and more.     At our [facility], we work as an integrated team, providing care to address both physical and emotional health. Your medical provider may refer you to see our Behavioral Health Clinician (BHC) ;scheduled virtually at your convenience.  Our BHC is available to all patients, visits generally last between 20-30 minutes, but can be longer or shorter, depending on patient need. The BHC offers help with stress management, coping with symptoms of depression and anxiety, major life changes , sleep issues, changing risky behavior, grief and loss, life stress, working on personal life goals, and  behavioral health issues, as these all affect your overall health and wellness.  The BHC is NOT available for the following: FMLA paperwork, court-ordered evaluations, specialty assessments (custody or disability), letters to employers, or obtaining certification for an emotional support animal. The BHC does not generally provide long-term therapy. You have the right to refuse integrated behavioral health services, or to reschedule to see the BHC at a later date.  Confidentiality exception: If it is suspected that a child or disabled adult is being abused or neglected, we are required by law to report that to either Child Protective Services or Adult Protective Services.  If you have a diagnosis of Bipolar affective disorder, Schizophrenia, or recurrent Major depressive disorder, we will recommend that you establish care with a psychiatrist, as these are lifelong, chronic conditions, and we want your overall emotional health and medications to  be more closely monitored. If you anticipate needing extended maternity leave due to mental health issues postpartum, it it recommended you inform your medical provider, so we can put in a referral to a psychiatrist as soon as possible. The BHC is unable to recommend an extended maternity leave for mental health issues. Your medical provider or BHC may refer you to a therapist for ongoing, traditional therapy, or to a psychiatrist, for medication management, if it would benefit your overall health. Depending on your insurance, you may have a copay or be charged a deductible, depending on your insurance, to see the BHC. If you are uninsured, it is recommended that you apply for financial assistance. (Forms may be requested at the front desk for in-person visits, via MyChart, or request a form during a virtual visit).  If you see the BHC more than 6 times, you will have to complete a comprehensive clinical assessment interview with the BHC to resume integrated services.  For virtual visits with the BHC, you must be physically in the state of [place] at the time of the visit. For example, if you live in [place], you will have to do an in-person visit with the BHC, and your out-of-state insurance may not cover behavioral health services in [place]. If you are going out of the state or country for any reason, the BHC may see you virtually when you return to [place], but not while you are physically outside of [place].    

## 2022-03-21 NOTE — Progress Notes (Signed)
New OB Intake  I connected with Chelle on 03/21/22 at 3:30 by MyChart Video Visit and verified that I am speaking with the correct person using two identifiers. Nurse is located at [facility] and pt is located at hospital with Mom, in quiet, secure space.  I discussed the limitations, risks, security and privacy concerns of performing an evaluation and management service by telephone and the availability of in person appointments. I also discussed with the patient that there may be a patient responsible charge related to this service. The patient expressed understanding and agreed to proceed.  I explained I am completing New OB Intake today. We discussed EDD of 10/03/22 that is based on LMP of 12/27/21. Pt is G2/P0010. I reviewed her allergies, medications, Medical/Surgical/OB history, and appropriate screenings. I informed her of [facility] services. [facility] information placed in AVS. Patient has been seen by a psychiatrist in past and was planning to make appointment until she found out she was pregnant. I encouraged her to make an appointment as she voices she has had some issues but score negative at present. Based on history, this is a low risk pregnancy.  Patient Active Problem List   Diagnosis Date Noted   Supervision of low-risk pregnancy 03/21/2022   Obesity in pregnancy 03/21/2022   PCOS (polycystic ovarian syndrome) 08/23/2020   MDD (major depressive disorder) 10/02/2019   Panic anxiety syndrome 10/02/2019   GAD (generalized anxiety disorder) 10/02/2019   Scoliosis 04/26/2011   ASTHMA, UNSPECIFIED 06/06/2006    Concerns addressed today  Delivery Plans Plans to deliver at [facility]. Patient given information for [facility] Healthy Baby website for more information about [facility]. Patient is not interested in water birth. Offered upcoming OB visit with CNM to discuss further.  MyChart/Babyscripts MyChart access verified. I explained pt will have some visits in office and some  virtually. Babyscripts instructions given and order placed.   Blood Pressure Cuff/Weight Scale Have a blood pressure cuff.  Explained after first prenatal appt pt will check weekly and document in Babyscripts. Patient has a scale.  Anatomy US Explained first scheduled [place] will be around 19 weeks. Anatomy US scheduled for 05/09/22 at 245. Pt notified to arrive at 230.  Labs Discussed Avelina Laine genetic screening with patient. Would like both Panorama and Horizon drawn at new OB visit. Routine prenatal labs needed.  COVID Vaccine Patient has not had COVID vaccine.   Is patient a CenteringPregnancy candidate?  Declined Declined due to Group setting   Is patient a Mom+Baby Combined Care candidate?  Declined    Social Determinants of Health Food Insecurity: Patient denies food insecurity. WIC Referral: Patient is interested in referral to [facility]. Link sent for her to sign up.  Transportation: Patient denies transportation needs. Childcare: Discussed no children allowed at ultrasound appointments. Offered childcare services; patient declines childcare services at this time.  First visit review I reviewed new OB appt with patient. I explained they will have a provider visit that includes Pregnancy exam, labs. Explained pt will be seen by Dorathy Kinsman ,CNM at first visit; encounter routed to appropriate provider. Explained that patient will be seen by pregnancy navigator following visit with provider.   Nancy Fetter 03/21/2022  4:17 PM

## 2022-03-26 ENCOUNTER — Encounter: Payer: Self-pay | Admitting: *Deleted

## 2022-03-26 ENCOUNTER — Ambulatory Visit (INDEPENDENT_AMBULATORY_CARE_PROVIDER_SITE_OTHER): Payer: Medicaid Other | Admitting: Advanced Practice Midwife

## 2022-03-26 ENCOUNTER — Other Ambulatory Visit: Payer: Self-pay

## 2022-03-26 ENCOUNTER — Other Ambulatory Visit (HOSPITAL_COMMUNITY)
Admission: RE | Admit: 2022-03-26 | Discharge: 2022-03-26 | Disposition: A | Discharge status: Home / Self Care | Payer: Medicaid Other | Source: Ambulatory Visit | Attending: Advanced Practice Midwife | Admitting: Advanced Practice Midwife

## 2022-03-26 VITALS — BP 132/80 | HR 108 | Wt 170.4 lb

## 2022-03-26 DIAGNOSIS — Z3A12 12 weeks gestation of pregnancy: Secondary | ICD-10-CM | POA: Diagnosis not present

## 2022-03-26 DIAGNOSIS — Z3491 Encounter for supervision of normal pregnancy, unspecified, first trimester: Secondary | ICD-10-CM | POA: Diagnosis not present

## 2022-03-26 DIAGNOSIS — Z3401 Encounter for supervision of normal first pregnancy, first trimester: Secondary | ICD-10-CM | POA: Insufficient documentation

## 2022-03-26 DIAGNOSIS — R8271 Bacteriuria: Secondary | ICD-10-CM

## 2022-03-26 LAB — OB RESULTS CONSOLE GBS: GBS: POSITIVE

## 2022-03-26 MED ORDER — ASPIRIN 81 MG PO TBEC: 81.0000 mg | DELAYED_RELEASE_TABLET | Freq: Every day | ORAL | 2 refills | Status: DC

## 2022-03-26 NOTE — Patient Instructions (Signed)
  CenteringPregnancy is a model of prenatal care that started 30 years ago and is used in about 600 practices around the [place]. You meet with a group of 8-12 women due around the same time as you. In Centering you will have individual time with the provider and meet as a group. There's much more time for discussion and learning. You will actually have much more time with your provider in Centering than in traditional prenatal care.? You will come directly into the Centering room and will not wait in the lobby so there is no wasted time. You will have 2-hour visits every 4 weeks then every 2 weeks. You will know your Centering prenatal appointments in advance. In your last month of pregnancy, you may also come in for some individual visits. Additional appointments can be scheduled if you need more care. Studies have shown that CenteringPregnancy improves birth outcomes. We have seen especially big improvements in fewer Black women delivering babies who are too small or born too early. Visit the website [facility] for more information. Let your provider or clinic staff know if you want to sign up.    

## 2022-03-26 NOTE — Progress Notes (Signed)
INITIAL PRENATAL VISIT  Subjective:   Lauren Wilkinson is 21 y.o. G2P0010 female being seen today for her first obstetrical visit. She has not received other prenatal care previously this pregnancy. This is not a planned pregnancy. This is a desired pregnancy.  She is at [redacted]w[redacted]d gestation by LMP and 6 week [place]. Her obstetrical history is significant for  nothing . Relationship with FOB: significant other, not living together. Patient does intend to breast feed. Pregnancy history fully reviewed.  Review of Systems:   ROS no complaints. N/V earlier in pregnancy has resolved. Lost 14 lb since being of pregnancy.   Objective:    Obstetric History OB History  Gravida Para Term Preterm AB Living  2       1 0  SAB IAB Ectopic Multiple Live Births      1   0    # Outcome Date GA Lbr Len/2nd Weight Sex Delivery Anes PTL Lv  2 Current           1 Ectopic 09/2021            Past Medical History:  Diagnosis Date   Anxiety    Asthma    as child   Depression    Scoliosis    UTI (urinary tract infection)     Past Surgical History:  Procedure Laterality Date   NO PAST SURGERIES      Current Outpatient Medications on File Prior to Visit  Medication Sig Dispense Refill   metoCLOPramide (REGLAN) 10 MG tablet Take 1 tablet (10 mg total) by mouth every 8 (eight) hours as needed. 30 tablet 0   ondansetron (ZOFRAN-ODT) 4 MG disintegrating tablet Take 1 tablet (4 mg total) by mouth every 8 (eight) hours as needed for nausea or vomiting. 15 tablet 0   Prenatal Vit-Fe Fumarate-FA (PRENATAL VITAMIN) 27-0.8 MG TABS Take 1 tablet by mouth daily. 30 tablet 11   promethazine (PHENERGAN) 12.5 MG tablet Take 1 tablet (12.5 mg total) by mouth at bedtime as needed for nausea or vomiting. 30 tablet 0   scopolamine (TRANSDERM-SCOP) 1 MG/3DAYS Place 1 patch (1.5 mg total) onto the skin every 3 (three) days. (Patient not taking: Reported on 03/26/2022) 10 patch 3   No current facility-administered medications  on file prior to visit.    No Known Allergies  Social History:  reports that she has never smoked. She has never used smokeless tobacco. She reports that she does not drink alcohol and does not use drugs.  Family History  Problem Relation Age of Onset   Asthma Mother    Hypertension Mother    Clotting disorder Mother    Diabetes Sister    Autism Brother     The following portions of the patient's history were reviewed and updated as appropriate: allergies, current medications, past family history, past medical history, past social history, past surgical history and problem list.  Physical Exam:  BP 132/80   Pulse (!) 108   Wt 170 lb 6.4 oz (77.3 kg)   LMP 12/27/2021 (Exact Date)   BMI 28.80 kg/m  CONSTITUTIONAL: Well-developed, well-nourished female in no acute distress.  HENT:  Normocephalic, atraumatic. Oropharynx is clear and moist EYES: Conjunctivae normal. No scleral icterus.  SKIN: Skin is warm and dry. No rash noted. Not diaphoretic. No erythema. No pallor. MUSCULOSKELETAL: Normal range of motion. No tenderness.  No cyanosis, clubbing, or edema.   NEUROLOGIC: Alert and oriented to person, place, and time. Normal muscle tone coordination.  PSYCHIATRIC:  Normal mood and affect. Normal behavior. Normal judgment and thought content. CARDIOVASCULAR: Normal heart rate noted. RESPIRATORY: Effort and rate normal. BREASTS: Declined ABDOMEN: Soft, no distention, tenderness, rebound or guarding. Fundal ht: 2/SP PELVIC: Declined. Wants to do Pap at later date.  Fetal Status: Fetal Heart Rate (bpm): 160   Movement: Absent     Indications for ASA therapy (per uptodate) One of the following: Previous pregnancy with preeclampsia, especially early onset and with an adverse outcome No Multifetal gestation No Chronic hypertension No Type 1 or 2 diabetes mellitus No Chronic kidney disease No Autoimmune disease (antiphospholipid syndrome, systemic lupus erythematosus) No  Two or more  of the following: Nulliparity Yes Obesity (body mass index >30 kg/m2) No Family history of preeclampsia in mother or sister No Age ?35 years No Sociodemographic characteristics (African American race, low socioeconomic level) Yes Personal risk factors (eg, previous pregnancy with low birth weight or small for gestational age infant, previous adverse pregnancy outcome [eg, stillbirth], interval >10 years between pregnancies) No  Indications for early GDM screening  First-degree relative with diabetes Yes  Early screening tests: FBS, A1C, Random CBG, glucose challenge   Assessment:   Pregnancy: G2P0010 1. [redacted] weeks gestation of pregnancy  - HORIZON CUSTOM - Culture, OB Urine - CBC/D/Plt+RPR+Rh+ABO+RubIgG... - Cytology - PAP( [facility]) - Hemoglobin A1c - PANORAMA PRENATAL TEST FULL PANEL  2. Encounter for supervision of low-risk pregnancy in first trimester  - HORIZON CUSTOM - Culture, OB Urine - CBC/D/Plt+RPR+Rh+ABO+RubIgG... - Cytology - PAP( ) - Hemoglobin A1c - PANORAMA PRENATAL TEST FULL PANEL    Plan:  Initial labs drawn. Prenatal vitamins. Rx ASA for reduction of risk for preeclampsia.  Problem list reviewed and updated. Genetic screening discussed: NIPS/First trimester screen/Quad/AFP ordered. Role of ultrasound in pregnancy discussed; Anatomy US: ordered. Amniocentesis discussed: not indicated. Follow up in 4 weeks. (Centering group 10) Discussed clinic routines, schedule of care and testing, genetic screening options, involvement of students and residents under the direct supervision of APPs and doctors and presence of female providers. Pt verbalized understanding.  [place], CNM 03/26/2022 10:15 AM

## 2022-03-27 ENCOUNTER — Encounter: Payer: Self-pay | Admitting: Internal Medicine

## 2022-03-27 LAB — GC/CHLAMYDIA PROBE AMP (~~LOC~~) NOT AT ARMC
Chlamydia: NEGATIVE
Comment: NEGATIVE
Comment: NORMAL
Neisseria Gonorrhea: NEGATIVE

## 2022-03-27 LAB — HEMOGLOBIN A1C
Est. average glucose Bld gHb Est-mCnc: 103 mg/dL
Hgb A1c MFr Bld: 5.2 % (ref 4.8–5.6)

## 2022-03-27 LAB — CBC/D/PLT+RPR+RH+ABO+RUBIGG...
Antibody Screen: NEGATIVE
Basophils Absolute: 0 10*3/uL (ref 0.0–0.2)
Basos: 0 %
EOS (ABSOLUTE): 0 10*3/uL (ref 0.0–0.4)
Eos: 0 %
HCV Ab: NONREACTIVE
HIV Screen 4th Generation wRfx: NONREACTIVE
Hematocrit: 35.1 % (ref 34.0–46.6)
Hemoglobin: 11.7 g/dL (ref 11.1–15.9)
Hepatitis B Surface Ag: NEGATIVE
Immature Grans (Abs): 0 10*3/uL (ref 0.0–0.1)
Immature Granulocytes: 0 %
Lymphocytes Absolute: 1.7 10*3/uL (ref 0.7–3.1)
Lymphs: 23 %
MCH: 29.5 pg (ref 26.6–33.0)
MCHC: 33.3 g/dL (ref 31.5–35.7)
MCV: 88 fL (ref 79–97)
Monocytes Absolute: 0.4 10*3/uL (ref 0.1–0.9)
Monocytes: 5 %
Neutrophils Absolute: 5.3 10*3/uL (ref 1.4–7.0)
Neutrophils: 72 %
Platelets: 289 10*3/uL (ref 150–450)
RBC: 3.97 x10E6/uL (ref 3.77–5.28)
RDW: 13.1 % (ref 11.7–15.4)
RPR Ser Ql: NONREACTIVE
Rh Factor: POSITIVE
Rubella Antibodies, IGG: 9.45 index (ref >0.99)
WBC: 7.5 10*3/uL (ref 3.4–10.8)

## 2022-03-27 LAB — HCV INTERPRETATION

## 2022-03-31 LAB — PANORAMA PRENATAL TEST FULL PANEL:PANORAMA TEST PLUS 5 ADDITIONAL MICRODELETIONS: FETAL FRACTION: 10.3

## 2022-03-31 LAB — CULTURE, OB URINE

## 2022-03-31 LAB — URINE CULTURE, OB REFLEX

## 2022-04-01 LAB — HORIZON CUSTOM: REPORT SUMMARY: NEGATIVE

## 2022-04-06 ENCOUNTER — Encounter: Payer: Self-pay | Admitting: Advanced Practice Midwife

## 2022-04-06 DIAGNOSIS — R8271 Bacteriuria: Secondary | ICD-10-CM | POA: Insufficient documentation

## 2022-04-06 MED ORDER — AMOXICILLIN 500 MG PO CAPS: 500.0000 mg | ORAL_CAPSULE | Freq: Three times a day (TID) | ORAL | 2 refills | Status: DC

## 2022-04-06 NOTE — Addendum Note (Signed)
Addended by: Dorathy Kinsman on: 04/06/2022 11:01 PM   Modules accepted: Orders

## 2022-04-09 NOTE — L&D Delivery Note (Signed)
OB/GYN Faculty Practice Delivery Note  Lauren Wilkinson is a 22 y.o. G2P0010 at [redacted]w[redacted]d admitted for SOL.   GBS Status:  GBS POS Maximum Maternal Temperature: 100.1 (at delivery)  Labor course: Initial SVE: 4 cm. Augmentation with: AROM and Pitocin. She then progressed to complete.  ROM: 14h 13m with clear fluid  Birth: At 0151 a viable female was delivered via spontaneous vaginal delivery (Presentation: ROA). Nuchal cord present: No.  Shoulders and body delivered in usual fashion. Infant placed directly on mom's abdomen for bonding/skin-to-skin, baby dried and stimulated. Cord clamped x 2 after 1 minute and cut by FOB.  Cord blood collected.  The placenta separated spontaneously and delivered via gentle cord traction.  Pitocin infused rapidly IV per protocol.  Fundus firm with massage.  Placenta inspected and appears to be intact with a 3 VC.  Placenta/Cord with the following complications: increased calcifications and hot to the touch d/t suspected chorioamnionitis.  Cord pH: n/a Sponge and instrument count were correct x2.  Intrapartum complications:  Chorio Anesthesia:  epidural Episiotomy: none Lacerations:  1st degree and vaginal Suture Repair: 3.0 vicryl EBL (mL): 218   Infant: APGAR (1 MIN): 8   APGAR (5 MINS): 9   Infant weight: pending  Mom to postpartum.  Baby to Couplet care / Skin to Skin. Placenta to L&D   Plans to Breast and bottlefeed Contraception: Depo-Provera injections Circumcision: wants inpatient  Note sent to [facility]: MCW for pp visit.  [name] , MSN, CNM 10/06/2022 2:41 AM

## 2022-04-10 ENCOUNTER — Encounter: Payer: Self-pay | Admitting: Advanced Practice Midwife

## 2022-04-12 ENCOUNTER — Encounter: Payer: Self-pay | Admitting: *Deleted

## 2022-04-17 ENCOUNTER — Telehealth: Payer: Self-pay

## 2022-04-17 NOTE — Telephone Encounter (Signed)
Called Pt to remind her of Centering tomorrow 04/18/22 @ 9a. Pt verbalized understanding.

## 2022-04-18 ENCOUNTER — Ambulatory Visit (INDEPENDENT_AMBULATORY_CARE_PROVIDER_SITE_OTHER): Payer: Medicaid Other | Admitting: Certified Nurse Midwife

## 2022-04-18 DIAGNOSIS — Z3A16 16 weeks gestation of pregnancy: Secondary | ICD-10-CM

## 2022-04-18 DIAGNOSIS — Z3492 Encounter for supervision of normal pregnancy, unspecified, second trimester: Secondary | ICD-10-CM

## 2022-04-18 DIAGNOSIS — R8271 Bacteriuria: Secondary | ICD-10-CM

## 2022-04-18 NOTE — Progress Notes (Signed)
Pt called in to reschedule appointment since she cannot miss work today (already missed this week for a court date). Will send in a different antibiotic and rescheduled her for next Wednesday morning on my schedule.  Gaylan Gerold, CNM, MSN, [place] Certified Nurse Midwife, [facility]

## 2022-04-23 ENCOUNTER — Encounter: Payer: Medicaid Other | Admitting: Advanced Practice Midwife

## 2022-04-24 NOTE — Progress Notes (Signed)
Pt canceled appt. 

## 2022-04-25 ENCOUNTER — Ambulatory Visit: Payer: Medicaid Other | Admitting: Certified Nurse Midwife

## 2022-04-25 DIAGNOSIS — Z3492 Encounter for supervision of normal pregnancy, unspecified, second trimester: Secondary | ICD-10-CM

## 2022-04-25 DIAGNOSIS — R8271 Bacteriuria: Secondary | ICD-10-CM

## 2022-04-25 DIAGNOSIS — Z3A17 17 weeks gestation of pregnancy: Secondary | ICD-10-CM

## 2022-04-27 ENCOUNTER — Encounter: Payer: Self-pay | Admitting: Advanced Practice Midwife

## 2022-05-02 ENCOUNTER — Encounter: Payer: Medicaid Other | Admitting: Family Medicine

## 2022-05-03 DIAGNOSIS — F418 Other specified anxiety disorders: Secondary | ICD-10-CM | POA: Insufficient documentation

## 2022-05-04 ENCOUNTER — Ambulatory Visit (INDEPENDENT_AMBULATORY_CARE_PROVIDER_SITE_OTHER): Payer: Medicaid Other | Admitting: Certified Nurse Midwife

## 2022-05-04 ENCOUNTER — Other Ambulatory Visit: Payer: Self-pay

## 2022-05-04 VITALS — BP 121/79 | HR 111 | Wt 172.2 lb

## 2022-05-04 DIAGNOSIS — Z23 Encounter for immunization: Secondary | ICD-10-CM

## 2022-05-04 DIAGNOSIS — Z3492 Encounter for supervision of normal pregnancy, unspecified, second trimester: Secondary | ICD-10-CM

## 2022-05-04 DIAGNOSIS — Z8639 Personal history of other endocrine, nutritional and metabolic disease: Secondary | ICD-10-CM

## 2022-05-04 DIAGNOSIS — F902 Attention-deficit hyperactivity disorder, combined type: Secondary | ICD-10-CM

## 2022-05-04 DIAGNOSIS — Z3A18 18 weeks gestation of pregnancy: Secondary | ICD-10-CM

## 2022-05-04 DIAGNOSIS — F418 Other specified anxiety disorders: Secondary | ICD-10-CM

## 2022-05-04 DIAGNOSIS — R8271 Bacteriuria: Secondary | ICD-10-CM

## 2022-05-04 MED ORDER — VITAMIN D 125 MCG (5000 UT) PO CAPS: 1.0000 | ORAL_CAPSULE | Freq: Every day | ORAL | 11 refills | Status: AC

## 2022-05-04 NOTE — Progress Notes (Signed)
PRENATAL VISIT NOTE  Subjective:  Lauren Wilkinson is a 22 y.o. G2P0010 at [redacted]w[redacted]d being seen today for ongoing prenatal care.  She is currently monitored for the following issues for this low-risk pregnancy and has ASTHMA, UNSPECIFIED; Scoliosis; PCOS (polycystic ovarian syndrome); Supervision of low-risk pregnancy; Obesity in pregnancy; Group B streptococcal bacteriuria; and Mixed anxiety depressive disorder on their problem list.  Patient reports  feeling very well but has some questions about her anxiety/irritability. Has had anxiety with depressive episodes since she was 18 and recently tried Lexapro but it made her violently ill so she stopped. Notes that she is still having bouts of irritability where she snaps at people and does not want to be around her family/people. Able to keep up with ADLs and is not socially isolating, these are short but intense mood swings .  Of note, she and her mother attended the visit - both are very animated, highly intelligent, talkative, heavily researched and fidgety. Mother divulged she has been diagnosed with ADHD and thinks her daughter exhibits similar symptoms.  Contractions: Not present. Vag. Bleeding: None.  Movement: Absent. Denies leaking of fluid.   The following portions of the patient's history were reviewed and updated as appropriate: allergies, current medications, past family history, past medical history, past social history, past surgical history and problem list.   Objective:   Vitals:   05/04/22 1125  BP: 121/79  Pulse: (!) 111  Weight: 172 lb 3.2 oz (78.1 kg)   Fetal Status: Fetal Heart Rate (bpm): 162   Movement: Absent     General:  Alert, oriented and cooperative. Patient is in no acute distress.  Skin: Skin is warm and dry. No rash noted.   Cardiovascular: Normal heart rate noted  Respiratory: Normal respiratory effort, no problems with respiration noted  Abdomen: Soft, gravid, appropriate for gestational age.  Pain/Pressure:  Present     Pelvic: Cervical exam deferred        Extremities: Normal range of motion.  Edema: None  Mental Status: Normal mood and affect. Normal behavior. Normal judgment and thought content.   Assessment and Plan:  Pregnancy: G2P0010 at [redacted]w[redacted]d 1. Encounter for supervision of low-risk pregnancy in second trimester - Doing well, feeling regular and vigorous fetal movement  - Flu Vaccine QUAD 36+ mos IM (Fluarix, Quad PF)   2. [redacted] weeks gestation of pregnancy - Routine OB care  - AFP, Serum, Open Spina Bifida  3. Group B streptococcal bacteriuria - Pt did not finish PCN, could not tolerate it. Will get repeat culture today. - Culture, OB Urine  4. Mixed anxiety depressive disorder - Did not tolerate Lexapro and is hesitant to try another prescription med given her reaction  5. ADHD (attention deficit hyperactivity disorder), combined type - Affirmed her mother's suspicions and reviewed typical symptoms in women with ADHD - Discussed how ADHD is affected by pregnancy including decreased tolerance to stimulation and increased coping mechanisms needed. Reviewed calming techniques. - Recommended vitamin D (has been deficient in the past) and DHA for mood support  Preterm labor symptoms and general obstetric precautions including but not limited to vaginal bleeding, contractions, leaking of fluid and fetal movement were reviewed in detail with the patient. Please refer to After Visit Summary for other counseling recommendations.   No follow-ups on file.  Future Appointments  Date Time Provider [place]  05/09/2022  2:30 PM [facility] NURSE [facility]  05/09/2022  2:45 PM [facility] US4 [facility]  05/16/2022  9:00 AM CENTERING  PROVIDER [facility]  06/13/2022  9:00 AM CENTERING PROVIDER [facility]  07/11/2022  9:00 AM CENTERING PROVIDER [facility]  07/25/2022  9:00 AM CENTERING PROVIDER [facility]  08/08/2022  9:00 AM CENTERING PROVIDER [facility]  08/22/2022  9:00 AM CENTERING  PROVIDER [facility]  09/05/2022  9:00 AM CENTERING PROVIDER [facility]  09/19/2022  9:00 AM CENTERING PROVIDER [facility]  10/03/2022  9:00 AM CENTERING PROVIDER [facility] [place] Tona Qualley, CNM

## 2022-05-06 LAB — AFP, SERUM, OPEN SPINA BIFIDA
AFP MoM: 1.55
AFP Value: 70.7 ng/mL
Gest. Age on Collection Date: 18.2 weeks
Maternal Age At EDD: 22.1 yr
OSBR Risk 1 IN: 4785
Test Results:: NEGATIVE
Weight: 172 [lb_av]

## 2022-05-06 LAB — URINE CULTURE, OB REFLEX

## 2022-05-06 LAB — CULTURE, OB URINE

## 2022-05-09 ENCOUNTER — Ambulatory Visit: Payer: Medicaid Other | Attending: Advanced Practice Midwife

## 2022-05-09 ENCOUNTER — Ambulatory Visit: Payer: Medicaid Other | Admitting: *Deleted

## 2022-05-09 VITALS — BP 139/84 | HR 104

## 2022-05-09 DIAGNOSIS — E669 Obesity, unspecified: Secondary | ICD-10-CM

## 2022-05-09 DIAGNOSIS — Z3490 Encounter for supervision of normal pregnancy, unspecified, unspecified trimester: Secondary | ICD-10-CM | POA: Diagnosis present

## 2022-05-09 DIAGNOSIS — O321XX0 Maternal care for breech presentation, not applicable or unspecified: Secondary | ICD-10-CM | POA: Insufficient documentation

## 2022-05-09 DIAGNOSIS — Z363 Encounter for antenatal screening for malformations: Secondary | ICD-10-CM | POA: Diagnosis not present

## 2022-05-09 DIAGNOSIS — Z3A19 19 weeks gestation of pregnancy: Secondary | ICD-10-CM | POA: Diagnosis not present

## 2022-05-09 DIAGNOSIS — Z3689 Encounter for other specified antenatal screening: Secondary | ICD-10-CM | POA: Diagnosis present

## 2022-05-09 DIAGNOSIS — O99210 Obesity complicating pregnancy, unspecified trimester: Secondary | ICD-10-CM

## 2022-05-09 DIAGNOSIS — O99212 Obesity complicating pregnancy, second trimester: Secondary | ICD-10-CM | POA: Diagnosis not present

## 2022-05-16 ENCOUNTER — Encounter: Payer: Self-pay | Admitting: Certified Nurse Midwife

## 2022-05-23 ENCOUNTER — Other Ambulatory Visit (HOSPITAL_COMMUNITY)
Admission: RE | Admit: 2022-05-23 | Discharge: 2022-05-23 | Disposition: A | Discharge status: Home / Self Care | Payer: Medicaid Other | Source: Ambulatory Visit | Attending: Certified Nurse Midwife | Admitting: Certified Nurse Midwife

## 2022-05-23 ENCOUNTER — Ambulatory Visit (INDEPENDENT_AMBULATORY_CARE_PROVIDER_SITE_OTHER): Payer: Medicaid Other | Admitting: Certified Nurse Midwife

## 2022-05-23 VITALS — BP 116/75 | HR 110 | Wt 173.0 lb

## 2022-05-23 DIAGNOSIS — Z3A21 21 weeks gestation of pregnancy: Secondary | ICD-10-CM

## 2022-05-23 DIAGNOSIS — Z3492 Encounter for supervision of normal pregnancy, unspecified, second trimester: Secondary | ICD-10-CM

## 2022-05-23 DIAGNOSIS — N898 Other specified noninflammatory disorders of vagina: Secondary | ICD-10-CM | POA: Insufficient documentation

## 2022-05-23 DIAGNOSIS — R8271 Bacteriuria: Secondary | ICD-10-CM

## 2022-05-24 LAB — CERVICOVAGINAL ANCILLARY ONLY
Bacterial Vaginitis (gardnerella): NEGATIVE
Candida Glabrata: NEGATIVE
Candida Vaginitis: NEGATIVE
Chlamydia: NEGATIVE
Comment: NEGATIVE
Comment: NEGATIVE
Comment: NEGATIVE
Comment: NEGATIVE
Comment: NEGATIVE
Comment: NORMAL
Neisseria Gonorrhea: NEGATIVE
Trichomonas: NEGATIVE

## 2022-05-27 NOTE — Progress Notes (Signed)
   PRENATAL VISIT NOTE  Subjective:  Lauren Wilkinson is a 22 y.o. G2P0010 at 39w4dbeing seen today for ongoing prenatal care.  She is currently monitored for the following issues for this low-risk pregnancy and has ASTHMA, UNSPECIFIED; Scoliosis; PCOS (polycystic ovarian syndrome); Supervision of low-risk pregnancy; Obesity in pregnancy; Group B streptococcal bacteriuria; and Mixed anxiety depressive disorder on their problem list.  Patient reports  vaginal discharge .  Contractions: Not present. Vag. Bleeding: None.  Movement: Present. Denies leaking of fluid.   The following portions of the patient's history were reviewed and updated as appropriate: allergies, current medications, past family history, past medical history, past social history, past surgical history and problem list.   Objective:   Vitals:   05/23/22 1355  BP: 116/75  Pulse: (!) 110  Weight: 173 lb (78.5 kg)    Fetal Status: Fetal Heart Rate (bpm): 158   Movement: Present     General:  Alert, oriented and cooperative. Patient is in no acute distress.  Skin: Skin is warm and dry. No rash noted.   Cardiovascular: Normal heart rate noted  Respiratory: Normal respiratory effort, no problems with respiration noted  Abdomen: Soft, gravid, appropriate for gestational age.  Pain/Pressure: Absent     Pelvic: Cervical exam deferred        Extremities: Normal range of motion.  Edema: None  Mental Status: Normal mood and affect. Normal behavior. Normal judgment and thought content.   Assessment and Plan:  Pregnancy: G2P0010 at 256w4d. Encounter for supervision of low-risk pregnancy in second trimester - Doing well, feeling regular and vigorous fetal movement  - Will return to CenteringPregnancy at next visit  2. [redacted] weeks gestation of pregnancy - Routine OB care   3. Group B streptococcal bacteriuria - Will treat with PCN in labor  4. Vaginal discharge - Cervicovaginal ancillary only( COPortales Preterm labor  symptoms and general obstetric precautions including but not limited to vaginal bleeding, contractions, leaking of fluid and fetal movement were reviewed in detail with the patient. Please refer to After Visit Summary for other counseling recommendations.   Return in about 2 weeks (around 06/06/2022) for IN-PERSON, LOB, CER[place]ick Future Appointments  Date Time Provider [place]3/09/2022  9:00 AM CENTERING PROVIDER [facility]4/06/2022  9:00 AM CENTERING PROVIDER [facility]/17/2024  9:00 AM CENTERING PROVIDER [facility]/04/2022  9:00 AM CENTERING PROVIDER [facility]5/15/2024  9:00 AM CENTERING PROVIDER WMT[facility]5/29/2024  9:00 AM CENTERING PROVIDER [facility]6/03/2023  9:00 AM CENTERING PROVIDER WMD[facility] HospitalMK[facility]6/26/2024  9:00 AM CENTERING PROVIDER [facility] WMGolden Gatealker, CNM

## 2022-06-04 ENCOUNTER — Encounter: Payer: Self-pay | Admitting: Certified Nurse Midwife

## 2022-06-04 DIAGNOSIS — B379 Candidiasis, unspecified: Secondary | ICD-10-CM

## 2022-06-04 MED ORDER — TERCONAZOLE 0.4 % VA CREA: 1.0000 | TOPICAL_CREAM | Freq: Every day | VAGINAL | 0 refills | Status: DC

## 2022-06-12 MED ORDER — FLUCONAZOLE 150 MG PO TABS: 150.0000 mg | ORAL_TABLET | Freq: Every day | ORAL | 0 refills | Status: DC

## 2022-06-13 ENCOUNTER — Other Ambulatory Visit: Payer: Self-pay

## 2022-06-13 ENCOUNTER — Ambulatory Visit: Payer: Self-pay | Admitting: Certified Nurse Midwife

## 2022-06-13 DIAGNOSIS — Z3492 Encounter for supervision of normal pregnancy, unspecified, second trimester: Secondary | ICD-10-CM

## 2022-06-13 DIAGNOSIS — R8271 Bacteriuria: Secondary | ICD-10-CM

## 2022-06-13 DIAGNOSIS — Z3A24 24 weeks gestation of pregnancy: Secondary | ICD-10-CM

## 2022-06-15 NOTE — Progress Notes (Signed)
Did not come to Centering for the 3rd time, recommend traditional prenatal care. Gaylan Gerold, CNM, MSN, [place] Certified Nurse Midwife, [facility]

## 2022-06-27 ENCOUNTER — Ambulatory Visit (INDEPENDENT_AMBULATORY_CARE_PROVIDER_SITE_OTHER): Payer: Medicaid Other | Admitting: Certified Nurse Midwife

## 2022-06-27 ENCOUNTER — Other Ambulatory Visit: Payer: Self-pay

## 2022-06-27 ENCOUNTER — Encounter: Payer: Self-pay | Admitting: Certified Nurse Midwife

## 2022-06-27 VITALS — BP 110/69 | HR 107 | Wt 182.0 lb

## 2022-06-27 DIAGNOSIS — Z3492 Encounter for supervision of normal pregnancy, unspecified, second trimester: Secondary | ICD-10-CM

## 2022-06-27 DIAGNOSIS — Z3A26 26 weeks gestation of pregnancy: Secondary | ICD-10-CM

## 2022-06-27 DIAGNOSIS — Z3493 Encounter for supervision of normal pregnancy, unspecified, third trimester: Secondary | ICD-10-CM

## 2022-06-27 NOTE — Progress Notes (Signed)
   PRENATAL VISIT NOTE  Subjective:  Lauren Wilkinson is a 22 y.o. G2P0010 at [redacted]w[redacted]d being seen today for ongoing prenatal care.  She is currently monitored for the following issues for this low-risk pregnancy and has ASTHMA, UNSPECIFIED; Scoliosis; PCOS (polycystic ovarian syndrome); Supervision of low-risk pregnancy; Obesity in pregnancy; Group B streptococcal bacteriuria; and Mixed anxiety depressive disorder on their problem list.  Patient reports no complaints, was in Centering Group 10 but could not make it work with her work constraints. Afternoon visits are much better for her schedule.  Contractions: Not present. Vag. Bleeding: None.  Movement: Present. Denies leaking of fluid.   The following portions of the patient's history were reviewed and updated as appropriate: allergies, current medications, past family history, past medical history, past social history, past surgical history and problem list.   Objective:   Vitals:   06/27/22 1525  BP: 110/69  Pulse: (!) 107  Weight: 182 lb (82.6 kg)   Fetal Status: Fetal Heart Rate (bpm): 151 Fundal Height: 26 cm Movement: Present     General:  Alert, oriented and cooperative. Patient is in no acute distress.  Skin: Skin is warm and dry. No rash noted.   Cardiovascular: Normal heart rate noted  Respiratory: Normal respiratory effort, no problems with respiration noted  Abdomen: Soft, gravid, appropriate for gestational age.  Pain/Pressure: Absent     Pelvic: Cervical exam deferred        Extremities: Normal range of motion.  Edema: None  Mental Status: Normal mood and affect. Normal behavior. Normal judgment and thought content.   Assessment and Plan:  Pregnancy: G2P0010 at [redacted]w[redacted]d 1. Encounter for supervision of low-risk pregnancy in third trimester - Doing well, feeling regular and vigorous fetal movement   2. [redacted] weeks gestation of pregnancy - Routine OB care including anticipatory guidance re GTT at next visit - Work excuse in  chart for next am appointment  Preterm labor symptoms and general obstetric precautions including but not limited to vaginal bleeding, contractions, leaking of fluid and fetal movement were reviewed in detail with the patient. Please refer to After Visit Summary for other counseling recommendations.   Return in about 2 weeks (around 07/11/2022) for IN-PERSON, LOB.  Future Appointments  Date Time Provider [place]  07/10/2022  8:35 AM Clarnce Flock, MD [facility]  07/10/2022  9:30 AM [facility] LAB [facility]  08/01/2022  1:15 PM Gabriel Carina, CNM [facility]  (Needs afternoon visits and prefers midwifery care, will request rescheduling).  Gabriel Carina, CNM

## 2022-07-10 ENCOUNTER — Encounter: Payer: Medicaid Other | Admitting: Family Medicine

## 2022-07-10 ENCOUNTER — Other Ambulatory Visit: Payer: Medicaid Other

## 2022-07-11 ENCOUNTER — Other Ambulatory Visit: Payer: Medicaid Other

## 2022-07-11 ENCOUNTER — Encounter: Payer: Medicaid Other | Admitting: Certified Nurse Midwife

## 2022-07-31 ENCOUNTER — Encounter: Payer: Self-pay | Admitting: Family Medicine

## 2022-07-31 ENCOUNTER — Other Ambulatory Visit: Payer: Self-pay

## 2022-07-31 DIAGNOSIS — N898 Other specified noninflammatory disorders of vagina: Secondary | ICD-10-CM

## 2022-07-31 MED ORDER — FLUCONAZOLE 150 MG PO TABS: 150.0000 mg | ORAL_TABLET | Freq: Once | ORAL | 0 refills | Status: AC

## 2022-08-01 ENCOUNTER — Telehealth: Payer: Self-pay | Admitting: Family Medicine

## 2022-08-01 ENCOUNTER — Encounter: Payer: Medicaid Other | Admitting: Certified Nurse Midwife

## 2022-08-01 NOTE — Telephone Encounter (Signed)
Patient want to be referred to a dentist

## 2022-08-01 NOTE — Telephone Encounter (Signed)
Called pt and unable to leave voicemail due to voicemail not set up.   Lauren Wilkinson

## 2022-08-02 ENCOUNTER — Encounter: Payer: Self-pay | Admitting: Family Medicine

## 2022-08-04 ENCOUNTER — Inpatient Hospital Stay (HOSPITAL_COMMUNITY)
Admission: AD | Admit: 2022-08-04 | Discharge: 2022-08-04 | Disposition: A | Discharge status: Home / Self Care | Payer: Medicaid Other | Attending: Obstetrics and Gynecology | Admitting: Obstetrics and Gynecology

## 2022-08-04 ENCOUNTER — Other Ambulatory Visit: Payer: Self-pay

## 2022-08-04 ENCOUNTER — Encounter (HOSPITAL_COMMUNITY): Payer: Self-pay | Admitting: Obstetrics and Gynecology

## 2022-08-04 DIAGNOSIS — Z3A31 31 weeks gestation of pregnancy: Secondary | ICD-10-CM

## 2022-08-04 DIAGNOSIS — R109 Unspecified abdominal pain: Secondary | ICD-10-CM | POA: Insufficient documentation

## 2022-08-04 DIAGNOSIS — O26893 Other specified pregnancy related conditions, third trimester: Secondary | ICD-10-CM | POA: Diagnosis not present

## 2022-08-04 DIAGNOSIS — B9689 Other specified bacterial agents as the cause of diseases classified elsewhere: Secondary | ICD-10-CM

## 2022-08-04 DIAGNOSIS — R1030 Lower abdominal pain, unspecified: Secondary | ICD-10-CM

## 2022-08-04 DIAGNOSIS — O26899 Other specified pregnancy related conditions, unspecified trimester: Secondary | ICD-10-CM

## 2022-08-04 DIAGNOSIS — Z3689 Encounter for other specified antenatal screening: Secondary | ICD-10-CM

## 2022-08-04 LAB — URINALYSIS, ROUTINE W REFLEX MICROSCOPIC
Bilirubin Urine: NEGATIVE
Glucose, UA: NEGATIVE mg/dL
Hgb urine dipstick: NEGATIVE
Ketones, ur: NEGATIVE mg/dL
Nitrite: NEGATIVE
Protein, ur: NEGATIVE mg/dL
Specific Gravity, Urine: 1.013 (ref 1.005–1.030)
pH: 6 (ref 5.0–8.0)

## 2022-08-04 LAB — WET PREP, GENITAL
Sperm: NONE SEEN
Trich, Wet Prep: NONE SEEN
WBC, Wet Prep HPF POC: >=10 — AB (ref <10)
Yeast Wet Prep HPF POC: NONE SEEN

## 2022-08-04 MED ORDER — METRONIDAZOLE 0.75 % VA GEL: 1.0000 | Freq: Every day | VAGINAL | 1 refills | Status: DC

## 2022-08-04 NOTE — MAU Note (Signed)
.  Lauren Wilkinson is a 22 y.o. at [redacted]w[redacted]d here in MAU reporting: lost mucous plug yesterday. Started having ctx an hour and a half ago - was every 5 minutes. "It's calmed down now" reports taking tylenol. Denies VB or LOF. +FM   Onset of complaint: 0200 Pain score: 8 Vitals:   08/04/22 0322  BP: 132/87  Pulse: (!) 106  Resp: 19  Temp: 98.5 F (36.9 C)  SpO2: 100%     FHT:145 Lab orders placed from triage:  UA

## 2022-08-04 NOTE — MAU Provider Note (Signed)
History     CSN: [id number]  Arrival date and time: 08/04/22 0309   Event Date/Time   First Provider Initiated Contact with Patient 08/04/22 0354      Lauren Wilkinson is a 22 y.o. G2P0010 at [redacted]w[redacted]d who receives care at [facility].  She presents today for contractions.  She states she was sleeping and noted onset of contractions around 0130.  She states she thinks they were occurring every 5 minutes, but was unsure.  She states she has been experiencing back pain and stomach tightening that is worse tonight.  She also reports some lower abdominal pain that she describes as intermittent pressure. She rates the pain a 8/10.  She reports taking tylenol around 0300 with improvement of symptoms. Patient reports good fetal movement and denies vaginal bleeding.  She reports she did lose her mucous plug on Thursday and noted some blood in that.  She denies recent sexual activity or vaginal discharge that is of concern.   OB History     Gravida  2   Para      Term      Preterm      AB  1   Living  0      SAB      IAB      Ectopic  1   Multiple      Live Births  0           Past Medical History:  Diagnosis Date   Anxiety    Asthma    as child   Depression    Scoliosis    UTI (urinary tract infection)     Past Surgical History:  Procedure Laterality Date   NO PAST SURGERIES      Family History  Problem Relation Age of Onset   Asthma Mother    Hypertension Mother    Clotting disorder Mother    Diabetes Sister    Autism Brother     Social History   Tobacco Use   Smoking status: Never   Smokeless tobacco: Never   Tobacco comments:    Sometimes vape  Vaping Use   Vaping Use: Former   Quit date: 01/24/2022   Substances: Nicotine  Substance Use Topics   Alcohol use: No   Drug use: No    Allergies: No Known Allergies  Medications Prior to Admission  Medication Sig Dispense Refill Last Dose   aspirin EC 81 MG tablet Take 1 tablet (81 mg total) by mouth  daily. Take after 12 weeks for prevention of preeclampssia later in pregnancy 300 tablet 2 Past Week   Prenatal Vit-Fe Fumarate-FA (PRENATAL VITAMIN) 27-0.8 MG TABS Take 1 tablet by mouth daily. 30 tablet 11 08/03/2022   Cholecalciferol (VITAMIN D) 125 MCG (5000 UT) CAPS Take 1 capsule by mouth daily after breakfast. (Patient not taking: Reported on 05/09/2022) 30 capsule 11    escitalopram (LEXAPRO) 20 MG tablet Take 20 mg by mouth daily. (Patient not taking: Reported on 05/09/2022)       Review of Systems  Gastrointestinal:  Positive for abdominal pain (Lower-pressure) and nausea (During ctx). Negative for constipation and diarrhea.  Genitourinary:  Negative for difficulty urinating, dysuria, vaginal bleeding and vaginal discharge.  Neurological:  Negative for dizziness, light-headedness and headaches.   Physical Exam   Blood pressure 132/87, pulse (!) 106, temperature 98.5 F (36.9 C), temperature source Oral, resp. rate 19, height 5' 4.5" (1.638 m), weight 84 kg, last menstrual period 12/27/2021, SpO2 100 %, unknown if  currently breastfeeding.  Physical Exam Vitals reviewed. Exam conducted with a chaperone present.  Constitutional:      Appearance: Normal appearance.  HENT:     Head: Normocephalic and atraumatic.  Eyes:     Conjunctiva/sclera: Conjunctivae normal.  Cardiovascular:     Rate and Rhythm: Normal rate.  Pulmonary:     Effort: Pulmonary effort is normal. No respiratory distress.  Genitourinary:    General: Normal vulva.     Comments: Speculum Exam: -Normal External Genitalia: Non tender, no apparent discharge at introitus.  -Vaginal Vault: Pink mucosa with good rugae. Small amt watery grayish white discharge -wet prep collected -Cervix:Pink, no lesions, cysts, or polyps.  Appears closed. No active bleeding from os-GC/CT collected -Bimanual Exam:  Dilation: Closed Exam by:: Gerrit Heck, CNM.  Musculoskeletal:     Cervical back: Normal range of motion.  Skin:     General: Skin is warm and dry.  Neurological:     Mental Status: She is alert and oriented to person, place, and time.  Psychiatric:        Mood and Affect: Mood normal.        Behavior: Behavior normal.     Fetal Assessment  145 bpm, Mod Var, -Decels, -Accels Toco: One ctx graphed  MAU Course   Results for orders placed or performed during the hospital encounter of 08/04/22 (from the past 24 hour(s))  Urinalysis, Routine w reflex microscopic -Urine, Clean Catch     Status: Abnormal   Collection Time: 08/04/22  3:25 AM  Result Value Ref Range   Color, Urine YELLOW YELLOW   APPearance HAZY (A) CLEAR   Specific Gravity, Urine 1.013 1.005 - 1.030   pH 6.0 5.0 - 8.0   Glucose, UA NEGATIVE NEGATIVE mg/dL   Hgb urine dipstick NEGATIVE NEGATIVE   Bilirubin Urine NEGATIVE NEGATIVE   Ketones, ur NEGATIVE NEGATIVE mg/dL   Protein, ur NEGATIVE NEGATIVE mg/dL   Nitrite NEGATIVE NEGATIVE   Leukocytes,Ua TRACE (A) NEGATIVE   RBC / HPF 0-5 0 - 5 RBC/hpf   WBC, UA 11-20 0 - 5 WBC/hpf   Bacteria, UA MANY (A) NONE SEEN   Squamous Epithelial / HPF 11-20 0 - 5 /HPF   Mucus PRESENT   Wet prep, genital     Status: Abnormal   Collection Time: 08/04/22  4:14 AM  Result Value Ref Range   Yeast Wet Prep HPF POC NONE SEEN NONE SEEN   Trich, Wet Prep NONE SEEN NONE SEEN   Clue Cells Wet Prep HPF POC PRESENT (A) NONE SEEN   WBC, Wet Prep HPF POC >=10 (A) <10   Sperm NONE SEEN    No results found.  MDM PE Labs:UA, Wet prep, GC/CT EFM Assessment and Plan  22 year old  G2P0010  SIUP at 31.3 weeks Cat I FT Abdominal Cramping  -POC Reviewed -Exam performed and findings discussed. -Informed that findings suggestive of BV infection. -NST in process. -Patient offered and declines pain medication. Reports tylenol, taken earlier, works well.  -Cultures collected. -fFN collected and discarded after exam reveals closed cervix. -Monitor and await results.   Cherre Robins MSN, CNM 08/04/2022,  3:54 AM   Reassessment (4:38 AM) -Results as above. -Urine culture ordered. -Clue cells noted and will treat considering exam. -Rx for metrogel sent to pharmacy on file.  -NST remains in process. Once reactive, for GA, will discharge to home. -Precautions reviewed. -Encouraged to call primary office or return to MAU if symptoms worsen or with the  onset of new symptoms.  Reassessment (4:59 AM) -NST reactive for GA. -Okay for discharge.   Cherre Robins MSN, CNM Advanced Practice Provider, [facility] [employer]

## 2022-08-05 LAB — URINE CULTURE: Culture: 60000 — AB

## 2022-08-06 ENCOUNTER — Telehealth: Payer: Self-pay | Admitting: Family Medicine

## 2022-08-06 DIAGNOSIS — B9689 Other specified bacterial agents as the cause of diseases classified elsewhere: Secondary | ICD-10-CM

## 2022-08-06 LAB — GC/CHLAMYDIA PROBE AMP (~~LOC~~) NOT AT ARMC
Chlamydia: NEGATIVE
Comment: NEGATIVE
Comment: NORMAL
Neisseria Gonorrhea: NEGATIVE

## 2022-08-06 MED ORDER — CLINDAMYCIN PHOSPHATE 2 % VA CREA
1.0000 | TOPICAL_CREAM | Freq: Every day | VAGINAL | 0 refills | Status: DC
Start: 2022-08-06 — End: 2022-10-08

## 2022-08-06 NOTE — Telephone Encounter (Signed)
Patient is needing to talk to someone about her test results , state the hospital has confused her

## 2022-08-06 NOTE — Telephone Encounter (Signed)
Returned patient's phone call. She states that she was seen over the weekend at MAU for contractions and was told she had BV but then she got a mychart message saying she didn't so now she is confused. Explained to patient she does have BV, the mychart message was regarding her urine culture. Patient verbalized understanding and states she doesn't think her body is handling antibiotics during this pregnancy well. She reports a lot of stomach pain/cramping and vomiting after using metrogel the other day & cannot keep using it. Discussed we can send in an alternative gel but it will also be an antibiotic. Patient verbalized understanding and states she will try that and let us know if she has any problems. Patient reports seeing a different provider this week but she had been seeing Jamilla previously. She states she would prefer a midwife. Offered appt for this Friday at 1115 with Shay. Patient verbalized understanding & was appreciative. Patient had no other questions.

## 2022-08-07 NOTE — Telephone Encounter (Signed)
Concerns addressed by Lyla Son RN by phone on 08/06/22.

## 2022-08-08 ENCOUNTER — Telehealth: Payer: Self-pay | Admitting: Lactation Services

## 2022-08-08 NOTE — Telephone Encounter (Signed)
Cindamycin 2% Cream PA approved through covermymeds.   Called and spoke with Jonny Ruiz at Pharmacy and informed him PA was approved.   Called patient to inform her medication is approved. Patient did not answer and no voicemail picked up. My Chart message sent to patient.

## 2022-08-09 ENCOUNTER — Encounter: Payer: Medicaid Other | Admitting: Family Medicine

## 2022-08-10 ENCOUNTER — Ambulatory Visit (INDEPENDENT_AMBULATORY_CARE_PROVIDER_SITE_OTHER): Payer: Medicaid Other | Admitting: Certified Nurse Midwife

## 2022-08-10 ENCOUNTER — Other Ambulatory Visit: Payer: Self-pay

## 2022-08-10 ENCOUNTER — Encounter: Payer: Self-pay | Admitting: Certified Nurse Midwife

## 2022-08-10 VITALS — BP 126/76 | HR 117 | Wt 181.0 lb

## 2022-08-10 DIAGNOSIS — N760 Acute vaginitis: Secondary | ICD-10-CM

## 2022-08-10 DIAGNOSIS — Z3A32 32 weeks gestation of pregnancy: Secondary | ICD-10-CM

## 2022-08-10 DIAGNOSIS — Z3493 Encounter for supervision of normal pregnancy, unspecified, third trimester: Secondary | ICD-10-CM

## 2022-08-10 DIAGNOSIS — B951 Streptococcus, group B, as the cause of diseases classified elsewhere: Secondary | ICD-10-CM

## 2022-08-10 DIAGNOSIS — B9689 Other specified bacterial agents as the cause of diseases classified elsewhere: Secondary | ICD-10-CM

## 2022-08-10 NOTE — Progress Notes (Signed)
   PRENATAL VISIT NOTE  Subjective:  Lauren Wilkinson is a 22 y.o. G2P0010 at [redacted]w[redacted]d being seen today for ongoing prenatal care.  She is currently monitored for the following issues for this low-risk pregnancy and has ASTHMA, UNSPECIFIED; Scoliosis; PCOS (polycystic ovarian syndrome); Supervision of low-risk pregnancy; Obesity in pregnancy; Group B streptococcal bacteriuria; and Mixed anxiety depressive disorder on their problem list.  Patient reports    .  Contractions: Irritability. Vag. Bleeding: None.  Movement: Present. Denies leaking of fluid.   The following portions of the patient's history were reviewed and updated as appropriate: allergies, current medications, past family history, past medical history, past social history, past surgical history and problem list.   Objective:   Vitals:   08/10/22 1143  BP: 126/76  Pulse: (!) 117  Weight: 181 lb (82.1 kg)    Fetal Status: Fetal Heart Rate (bpm): 148 Fundal Height: 32 cm Movement: Present     General:  Alert, oriented and cooperative. Patient is in no acute distress.  Skin: Skin is warm and dry. No rash noted.   Cardiovascular: Normal heart rate noted  Respiratory: Normal respiratory effort, no problems with respiration noted  Abdomen: Soft, gravid, appropriate for gestational age.  Pain/Pressure: Absent     Pelvic: Cervical exam deferred        Extremities: Normal range of motion.  Edema: None  Mental Status: Normal mood and affect. Normal behavior. Normal judgment and thought content.   Assessment and Plan:  Pregnancy: G2P0010 at [redacted]w[redacted]d 1. Encounter for supervision of low-risk pregnancy in third trimester - Patient doing well. Reports feeling vigorous and frequent fetal movement.  - Patient's mother present for visit today and supportive.   2. [redacted] weeks gestation of pregnancy - Reviewed third trimester and labor expectation.  - 28 weeks labs and GTT scheduled for next week  - Tdap at next visit. Information provided on  vaccines today.   3. BV (bacterial vaginosis) - Patient experiencing gastric upset with oral medication, and abdominal pain and cramping with the gel.  - Recently prescribed Clindamycin for BV but has not started taking yet. Will start today. Recommended to take on a full stomach and with a full glass of water. Patient verbalized understanding.  - Also recommended the use of Pre and Probiotic for vaginal health.   4. Positive GBS test - Plan to treat in labor.   Preterm labor symptoms and general obstetric precautions including but not limited to vaginal bleeding, contractions, leaking of fluid and fetal movement were reviewed in detail with the patient. Please refer to After Visit Summary for other counseling recommendations.   Return in about 2 weeks (around 08/24/2022) for LOB.  Future Appointments  Date Time Provider Department Center  08/15/2022  8:50 AM [facility]  08/24/2022  8:15 AM Carlynn Herald, CNM [facility]   Dewan Emond Danella Deis) Suzie Portela, MSN, CNM  Center for [facility]  08/10/22 12:30 PM

## 2022-08-15 ENCOUNTER — Other Ambulatory Visit: Payer: Medicaid Other

## 2022-08-16 ENCOUNTER — Encounter: Payer: Self-pay | Admitting: Certified Nurse Midwife

## 2022-08-16 ENCOUNTER — Other Ambulatory Visit: Payer: Self-pay

## 2022-08-16 ENCOUNTER — Encounter: Payer: Self-pay | Admitting: Family Medicine

## 2022-08-16 ENCOUNTER — Other Ambulatory Visit: Payer: Medicaid Other

## 2022-08-24 ENCOUNTER — Other Ambulatory Visit: Payer: Medicaid Other

## 2022-08-24 ENCOUNTER — Ambulatory Visit (INDEPENDENT_AMBULATORY_CARE_PROVIDER_SITE_OTHER): Payer: Medicaid Other | Admitting: Certified Nurse Midwife

## 2022-08-24 ENCOUNTER — Other Ambulatory Visit: Payer: Self-pay

## 2022-08-24 VITALS — BP 121/78 | HR 104 | Wt 186.0 lb

## 2022-08-24 DIAGNOSIS — Z3A34 34 weeks gestation of pregnancy: Secondary | ICD-10-CM

## 2022-08-24 DIAGNOSIS — Z3493 Encounter for supervision of normal pregnancy, unspecified, third trimester: Secondary | ICD-10-CM

## 2022-08-24 DIAGNOSIS — N760 Acute vaginitis: Secondary | ICD-10-CM

## 2022-08-24 DIAGNOSIS — Z3403 Encounter for supervision of normal first pregnancy, third trimester: Secondary | ICD-10-CM

## 2022-08-24 DIAGNOSIS — Z3009 Encounter for other general counseling and advice on contraception: Secondary | ICD-10-CM

## 2022-08-24 DIAGNOSIS — B9689 Other specified bacterial agents as the cause of diseases classified elsewhere: Secondary | ICD-10-CM

## 2022-08-25 LAB — RPR: RPR Ser Ql: NONREACTIVE

## 2022-08-25 LAB — CBC
Hematocrit: 31.1 % — ABNORMAL LOW (ref 34.0–46.6)
Hemoglobin: 10.4 g/dL — ABNORMAL LOW (ref 11.1–15.9)
MCH: 29.4 pg (ref 26.6–33.0)
MCHC: 33.4 g/dL (ref 31.5–35.7)
MCV: 88 fL (ref 79–97)
Platelets: 256 10*3/uL (ref 150–450)
RBC: 3.54 x10E6/uL — ABNORMAL LOW (ref 3.77–5.28)
RDW: 13.1 % (ref 11.7–15.4)
WBC: 8.6 10*3/uL (ref 3.4–10.8)

## 2022-08-25 LAB — GLUCOSE TOLERANCE, 2 HOURS W/ 1HR
Glucose, 1 hour: 117 mg/dL (ref 70–179)
Glucose, 2 hour: 93 mg/dL (ref 70–152)
Glucose, Fasting: 68 mg/dL — ABNORMAL LOW (ref 70–91)

## 2022-08-25 LAB — HIV ANTIBODY (ROUTINE TESTING W REFLEX): HIV Screen 4th Generation wRfx: NONREACTIVE

## 2022-08-26 NOTE — Progress Notes (Signed)
   PRENATAL VISIT NOTE  Subjective:  Lauren Wilkinson is a 22 y.o. G2P0010 at [redacted]w[redacted]d being seen today for ongoing prenatal care.  She is currently monitored for the following issues for this low-risk pregnancy and has ASTHMA, UNSPECIFIED; Scoliosis; PCOS (polycystic ovarian syndrome); Supervision of low-risk pregnancy; Obesity in pregnancy; Group B streptococcal bacteriuria; and Mixed anxiety depressive disorder on their problem list.  Patient reports no complaints.   . Vag. Bleeding: None.  Movement: Present. Denies leaking of fluid.   The following portions of the patient's history were reviewed and updated as appropriate: allergies, current medications, past family history, past medical history, past social history, past surgical history and problem list.   Objective:   Vitals:   08/24/22 0915  BP: 121/78  Pulse: (!) 104  Weight: 186 lb (84.4 kg)    Fetal Status: Fetal Heart Rate (bpm): 150   Movement: Present     General:  Alert, oriented and cooperative. Patient is in no acute distress.  Skin: Skin is warm and dry. No rash noted.   Cardiovascular: Normal heart rate noted  Respiratory: Normal respiratory effort, no problems with respiration noted  Abdomen: Soft, gravid, appropriate for gestational age.  Pain/Pressure: Absent     Pelvic: Cervical exam deferred        Extremities: Normal range of motion.  Edema: None  Mental Status: Normal mood and affect. Normal behavior. Normal judgment and thought content.   Assessment and Plan:  Pregnancy: G2P0010 at [redacted]w[redacted]d 1. Encounter for supervision of low-risk first pregnancy in third trimester - Patient is doing well. She reports vigorous and frequent fetal movement.   2. [redacted] weeks gestation of pregnancy - GTT collection today.  - GBS testing at next visit.   3. BV (bacterial vaginosis) - Patient had a rash to the left side of her abdomen and face while taking Clinda antibiotics, but patient reports completing the course.    4. Birth  control counseling - Briefly discussed birth control options for postpartum period.  - Patient undecided on Depo in PP versus Post-placental ParaGard or Mirena.   Preterm labor symptoms and general obstetric precautions including but not limited to vaginal bleeding, contractions, leaking of fluid and fetal movement were reviewed in detail with the patient. Please refer to After Visit Summary for other counseling recommendations.   Return in about 2 weeks (around 09/07/2022) for LOB.  Future Appointments  Date Time Provider Department Center  09/10/2022 10:55 AM [place] Bing, MD [facility]    Akeria Hedstrom Danella Deis) Suzie Portela, MSN, CNM  Center for [facility]  08/26/22 10:17 PM

## 2022-09-03 ENCOUNTER — Encounter: Payer: Self-pay | Admitting: Certified Nurse Midwife

## 2022-09-05 ENCOUNTER — Encounter: Payer: Self-pay | Admitting: *Deleted

## 2022-09-10 ENCOUNTER — Ambulatory Visit (INDEPENDENT_AMBULATORY_CARE_PROVIDER_SITE_OTHER): Payer: Medicaid Other | Admitting: Obstetrics and Gynecology

## 2022-09-10 ENCOUNTER — Other Ambulatory Visit: Payer: Self-pay

## 2022-09-10 ENCOUNTER — Other Ambulatory Visit (HOSPITAL_COMMUNITY)
Admission: RE | Admit: 2022-09-10 | Discharge: 2022-09-10 | Disposition: A | Discharge status: Home / Self Care | Payer: Medicaid Other | Source: Ambulatory Visit | Attending: Obstetrics and Gynecology | Admitting: Obstetrics and Gynecology

## 2022-09-10 VITALS — BP 119/78 | HR 106 | Wt 183.8 lb

## 2022-09-10 DIAGNOSIS — R8271 Bacteriuria: Secondary | ICD-10-CM

## 2022-09-10 DIAGNOSIS — Z3A36 36 weeks gestation of pregnancy: Secondary | ICD-10-CM | POA: Diagnosis present

## 2022-09-10 DIAGNOSIS — O26893 Other specified pregnancy related conditions, third trimester: Secondary | ICD-10-CM | POA: Diagnosis not present

## 2022-09-10 NOTE — Progress Notes (Signed)
   PRENATAL VISIT NOTE  Subjective:  Lauren Wilkinson is a 22 y.o. G2P0010 at [redacted]w[redacted]d being seen today for ongoing prenatal care.  She is currently monitored for the following issues for this low-risk pregnancy and has ASTHMA, UNSPECIFIED; Scoliosis; PCOS (polycystic ovarian syndrome); Supervision of low-risk pregnancy; Obesity in pregnancy; Group B streptococcal bacteriuria; and Mixed anxiety depressive disorder on their problem list.  Patient reports no complaints.  Contractions: Irritability. Vag. Bleeding: None.  Movement: Present. Denies leaking of fluid.   The following portions of the patient's history were reviewed and updated as appropriate: allergies, current medications, past family history, past medical history, past social history, past surgical history and problem list.   Objective:   Vitals:   09/10/22 1121  BP: 119/78  Pulse: (!) 106  Weight: 183 lb 12.8 oz (83.4 kg)    Fetal Status: Fetal Heart Rate (bpm): 146 Fundal Height: 35 cm Movement: Present     General:  Alert, oriented and cooperative. Patient is in no acute distress.  Skin: Skin is warm and dry. No rash noted.   Cardiovascular: Normal heart rate noted  Respiratory: Normal respiratory effort, no problems with respiration noted  Abdomen: Soft, gravid, appropriate for gestational age.  Pain/Pressure: Present     Pelvic: Cervical exam performed with chaperone Dilation: 1 Effacement (%): 50 Station: Ballotable  Extremities: Normal range of motion.  Edema: None  Mental Status: Normal mood and affect. Normal behavior. Normal judgment and thought content.   Assessment and Plan:  Pregnancy: G2P0010 at [redacted]w[redacted]d 1. [redacted] weeks gestation of pregnancy  - GC/Chlamydia probe amp ([place])not at [facility]  2. Group B streptococcal bacteriuria  Preterm labor symptoms and general obstetric precautions including but not limited to vaginal bleeding, contractions, leaking of fluid and fetal movement were reviewed in detail with the  patient. Please refer to After Visit Summary for other counseling recommendations.   Return in about 1 week (around 09/17/2022) for q7-10d , low risk ob, in person, md or app.  Future Appointments  Date Time Provider Department Center  09/19/2022  1:15 PM Osborne Oman [facility]    [place] Bing, MD

## 2022-09-11 LAB — GC/CHLAMYDIA PROBE AMP (~~LOC~~) NOT AT ARMC
Chlamydia: NEGATIVE
Comment: NEGATIVE
Comment: NORMAL
Neisseria Gonorrhea: NEGATIVE

## 2022-09-18 ENCOUNTER — Encounter: Payer: Self-pay | Admitting: Certified Nurse Midwife

## 2022-09-18 DIAGNOSIS — B379 Candidiasis, unspecified: Secondary | ICD-10-CM

## 2022-09-18 MED ORDER — FLUCONAZOLE 150 MG PO TABS
150.0000 mg | ORAL_TABLET | Freq: Once | ORAL | 0 refills | Status: AC
Start: 2022-09-18 — End: 2022-09-18

## 2022-09-19 ENCOUNTER — Ambulatory Visit (INDEPENDENT_AMBULATORY_CARE_PROVIDER_SITE_OTHER): Payer: Medicaid Other | Admitting: Certified Nurse Midwife

## 2022-09-19 ENCOUNTER — Other Ambulatory Visit: Payer: Self-pay

## 2022-09-19 VITALS — BP 124/86 | HR 109 | Wt 188.0 lb

## 2022-09-19 DIAGNOSIS — Z3493 Encounter for supervision of normal pregnancy, unspecified, third trimester: Secondary | ICD-10-CM

## 2022-09-19 DIAGNOSIS — Z3A38 38 weeks gestation of pregnancy: Secondary | ICD-10-CM | POA: Diagnosis not present

## 2022-09-19 DIAGNOSIS — R8271 Bacteriuria: Secondary | ICD-10-CM

## 2022-09-19 NOTE — Progress Notes (Signed)
   PRENATAL VISIT NOTE  Subjective:  Lauren Wilkinson is a 22 y.o. G2P0010 at [redacted]w[redacted]d being seen today for ongoing prenatal care.  She is currently monitored for the following issues for this low-risk pregnancy and has ASTHMA, UNSPECIFIED; Scoliosis; PCOS (polycystic ovarian syndrome); Supervision of low-risk pregnancy; Obesity in pregnancy; Group B streptococcal bacteriuria; and Mixed anxiety depressive disorder on their problem list.  Patient reports occasional contractions.  Contractions: Not present. Vag. Bleeding: None.  Movement: Present. Denies leaking of fluid.   The following portions of the patient's history were reviewed and updated as appropriate: allergies, current medications, past family history, past medical history, past social history, past surgical history and problem list.   Objective:   Vitals:   09/19/22 1341  BP: 124/86  Pulse: (!) 109  Weight: 188 lb (85.3 kg)    Fetal Status: Fetal Heart Rate (bpm): 150 Fundal Height: 38 cm Movement: Present     General:  Alert, oriented and cooperative. Patient is in no acute distress.  Skin: Skin is warm and dry. No rash noted.   Cardiovascular: Normal heart rate noted  Respiratory: Normal respiratory effort, no problems with respiration noted  Abdomen: Soft, gravid, appropriate for gestational age.  Pain/Pressure: Absent     Pelvic: Cervical exam performed in the presence of a chaperone Dilation: 1 Effacement (%): 50 Station: -1  Extremities: Normal range of motion.  Edema: None  Mental Status: Normal mood and affect. Normal behavior. Normal judgment and thought content.   Assessment and Plan:  Pregnancy: G2P0010 at [redacted]w[redacted]d 1. Encounter for supervision of low-risk pregnancy in third trimester - Doing well, feeling regular and vigorous fetal movement   2. [redacted] weeks gestation of pregnancy - Routine OB care   3. Group B streptococcal bacteriuria - Will treat in labor  Term labor symptoms and general obstetric precautions  including but not limited to vaginal bleeding, contractions, leaking of fluid and fetal movement were reviewed in detail with the patient. Please refer to After Visit Summary for other counseling recommendations.   Return in about 1 week (around 09/26/2022) for IN-PERSON, LOB.  Future Appointments  Date Time Provider Department Center  09/26/2022 10:55 AM Bernerd Limbo, CNM [facility]   Bernerd Limbo, CNM

## 2022-09-25 ENCOUNTER — Encounter: Payer: Self-pay | Admitting: Certified Nurse Midwife

## 2022-09-25 ENCOUNTER — Encounter: Payer: Self-pay | Admitting: Family Medicine

## 2022-09-26 ENCOUNTER — Ambulatory Visit (INDEPENDENT_AMBULATORY_CARE_PROVIDER_SITE_OTHER): Payer: Medicaid Other | Admitting: Certified Nurse Midwife

## 2022-09-26 VITALS — BP 128/78 | HR 109 | Wt 189.2 lb

## 2022-09-26 DIAGNOSIS — Z3493 Encounter for supervision of normal pregnancy, unspecified, third trimester: Secondary | ICD-10-CM

## 2022-09-26 DIAGNOSIS — Z3A39 39 weeks gestation of pregnancy: Secondary | ICD-10-CM

## 2022-09-26 DIAGNOSIS — R8271 Bacteriuria: Secondary | ICD-10-CM

## 2022-09-26 NOTE — Progress Notes (Signed)
   PRENATAL VISIT NOTE  Subjective:  Lauren Wilkinson is a 22 y.o. G2P0010 at [redacted]w[redacted]d being seen today for ongoing prenatal care.  She is currently monitored for the following issues for this low-risk pregnancy and has ASTHMA, UNSPECIFIED; Scoliosis; PCOS (polycystic ovarian syndrome); Supervision of low-risk pregnancy; Obesity in pregnancy; Group B streptococcal bacteriuria; and Mixed anxiety depressive disorder on their problem list.  Patient reports no complaints.  Contractions: Irritability. Vag. Bleeding: None.  Movement: Present. Denies leaking of fluid.   The following portions of the patient's history were reviewed and updated as appropriate: allergies, current medications, past family history, past medical history, past social history, past surgical history and problem list.   Objective:   Vitals:   09/26/22 1203  BP: 128/78  Pulse: (!) 109  Weight: 189 lb 3.2 oz (85.8 kg)    Fetal Status: Fetal Heart Rate (bpm): 140   Movement: Present     General:  Alert, oriented and cooperative. Patient is in no acute distress.  Skin: Skin is warm and dry. No rash noted.   Cardiovascular: Normal heart rate noted  Respiratory: Normal respiratory effort, no problems with respiration noted  Abdomen: Soft, gravid, appropriate for gestational age.  Pain/Pressure: Absent     Pelvic: Cervical exam deferred        Extremities: Normal range of motion.  Edema: None  Mental Status: Normal mood and affect. Normal behavior. Normal judgment and thought content.   Assessment and Plan:  Pregnancy: G2P0010 at [redacted]w[redacted]d 1. Encounter for supervision of low-risk pregnancy in third trimester - Doing well, feeling regular and vigorous fetal movement   2. [redacted] weeks gestation of pregnancy - Routine OB care, encouraged red raspberry leaf, sex and walking to encourage labor - IOL scheduled for 7/5 am, orders to be placed.   3. Group B streptococcal bacteriuria - Will treat in labor  Term labor symptoms and general  obstetric precautions including but not limited to vaginal bleeding, contractions, leaking of fluid and fetal movement were reviewed in detail with the patient. Please refer to After Visit Summary for other counseling recommendations.   Return in about 1 week (around 10/03/2022) for LOB, NST/BPP.  Future Appointments  Date Time Provider Department Center  10/12/2022  6:30 AM [facility] SCHED ROOM [facility] None    Bernerd Limbo, CNM

## 2022-09-28 ENCOUNTER — Telehealth (HOSPITAL_COMMUNITY): Payer: Self-pay | Admitting: *Deleted

## 2022-09-28 ENCOUNTER — Encounter (HOSPITAL_COMMUNITY): Payer: Self-pay

## 2022-09-28 NOTE — Telephone Encounter (Signed)
Preadmission screen  

## 2022-10-02 ENCOUNTER — Encounter (HOSPITAL_COMMUNITY): Payer: Self-pay | Admitting: *Deleted

## 2022-10-02 ENCOUNTER — Telehealth (HOSPITAL_COMMUNITY): Payer: Self-pay | Admitting: *Deleted

## 2022-10-02 NOTE — Telephone Encounter (Signed)
Preadmission screen  

## 2022-10-02 NOTE — Progress Notes (Unsigned)
   PRENATAL VISIT NOTE  Subjective:  Lauren Wilkinson is a 22 y.o. G2P0010 at [redacted]w[redacted]d being seen today for ongoing prenatal care.  She is currently monitored for the following issues for this low-risk pregnancy and has ASTHMA, UNSPECIFIED; Scoliosis; PCOS (polycystic ovarian syndrome); Supervision of low-risk pregnancy; Obesity in pregnancy; Group B streptococcal bacteriuria; and Mixed anxiety depressive disorder on their problem list.  Patient reports {sx:14538}.   .  .   . Denies leaking of fluid.   The following portions of the patient's history were reviewed and updated as appropriate: allergies, current medications, past family history, past medical history, past social history, past surgical history and problem list.   Objective:  There were no vitals filed for this visit.  Fetal Status:           General:  Alert, oriented and cooperative. Patient is in no acute distress.  Skin: Skin is warm and dry. No rash noted.   Cardiovascular: Normal heart rate noted  Respiratory: Normal respiratory effort, no problems with respiration noted  Abdomen: Soft, gravid, appropriate for gestational age.        Pelvic: {Blank single:19197::"Cervical exam performed in the presence of a chaperone","Cervical exam deferred"}        Extremities: Normal range of motion.     Mental Status: Normal mood and affect. Normal behavior. Normal judgment and thought content.   Assessment and Plan:  Pregnancy: G2P0010 at 110w6d 1. Encounter for supervision of low-risk pregnancy in third trimester ***  2. [redacted] weeks gestation of pregnancy ***  3. Group B streptococcal bacteriuria ***  {Blank single:19197::"Term","Preterm"} labor symptoms and general obstetric precautions including but not limited to vaginal bleeding, contractions, leaking of fluid and fetal movement were reviewed in detail with the patient. Please refer to After Visit Summary for other counseling recommendations.   No follow-ups on file.  Future  Appointments  Date Time Provider Department Center  10/03/2022  1:55 PM Osborne Oman [facility]  10/12/2022  6:30 AM [facility] SCHED ROOM [facility] None    Bernerd Limbo, CNM

## 2022-10-03 ENCOUNTER — Ambulatory Visit (INDEPENDENT_AMBULATORY_CARE_PROVIDER_SITE_OTHER): Payer: Medicaid Other | Admitting: Certified Nurse Midwife

## 2022-10-03 ENCOUNTER — Other Ambulatory Visit: Payer: Self-pay

## 2022-10-03 VITALS — BP 114/79 | HR 103 | Wt 190.0 lb

## 2022-10-03 DIAGNOSIS — Z3493 Encounter for supervision of normal pregnancy, unspecified, third trimester: Secondary | ICD-10-CM

## 2022-10-03 DIAGNOSIS — R8271 Bacteriuria: Secondary | ICD-10-CM

## 2022-10-03 DIAGNOSIS — Z3A40 40 weeks gestation of pregnancy: Secondary | ICD-10-CM

## 2022-10-05 ENCOUNTER — Inpatient Hospital Stay (HOSPITAL_COMMUNITY): Payer: Medicaid Other | Admitting: Anesthesiology

## 2022-10-05 ENCOUNTER — Encounter (HOSPITAL_COMMUNITY): Payer: Self-pay | Admitting: Obstetrics and Gynecology

## 2022-10-05 ENCOUNTER — Inpatient Hospital Stay (HOSPITAL_COMMUNITY)
Admission: AD | Admit: 2022-10-05 | Discharge: 2022-10-08 | DRG: 805 | Disposition: A | Discharge status: Home / Self Care | Payer: Medicaid Other | Attending: Obstetrics & Gynecology | Admitting: Obstetrics & Gynecology

## 2022-10-05 ENCOUNTER — Inpatient Hospital Stay (HOSPITAL_COMMUNITY)
Admission: AD | Admit: 2022-10-05 | Payer: Medicaid Other | Source: Home / Self Care | Admitting: Obstetrics and Gynecology

## 2022-10-05 DIAGNOSIS — O411230 Chorioamnionitis, third trimester, not applicable or unspecified: Secondary | ICD-10-CM | POA: Diagnosis present

## 2022-10-05 DIAGNOSIS — O480 Post-term pregnancy: Principal | ICD-10-CM | POA: Diagnosis present

## 2022-10-05 DIAGNOSIS — O4202 Full-term premature rupture of membranes, onset of labor within 24 hours of rupture: Secondary | ICD-10-CM | POA: Diagnosis not present

## 2022-10-05 DIAGNOSIS — O99344 Other mental disorders complicating childbirth: Secondary | ICD-10-CM | POA: Diagnosis not present

## 2022-10-05 DIAGNOSIS — O99824 Streptococcus B carrier state complicating childbirth: Secondary | ICD-10-CM | POA: Diagnosis present

## 2022-10-05 DIAGNOSIS — Z3A40 40 weeks gestation of pregnancy: Secondary | ICD-10-CM

## 2022-10-05 DIAGNOSIS — O99820 Streptococcus B carrier state complicating pregnancy: Secondary | ICD-10-CM | POA: Diagnosis not present

## 2022-10-05 DIAGNOSIS — Z3490 Encounter for supervision of normal pregnancy, unspecified, unspecified trimester: Secondary | ICD-10-CM

## 2022-10-05 DIAGNOSIS — R8271 Bacteriuria: Secondary | ICD-10-CM | POA: Diagnosis present

## 2022-10-05 DIAGNOSIS — O99214 Obesity complicating childbirth: Secondary | ICD-10-CM | POA: Diagnosis not present

## 2022-10-05 LAB — TYPE AND SCREEN
ABO/RH(D): B POS
Antibody Screen: NEGATIVE

## 2022-10-05 LAB — CBC
HCT: 34.4 % — ABNORMAL LOW (ref 36.0–46.0)
Hemoglobin: 11.2 g/dL — ABNORMAL LOW (ref 12.0–15.0)
MCH: 28.7 pg (ref 26.0–34.0)
MCHC: 32.6 g/dL (ref 30.0–36.0)
MCV: 88.2 fL (ref 80.0–100.0)
Platelets: 243 10*3/uL (ref 150–400)
RBC: 3.9 MIL/uL (ref 3.87–5.11)
RDW: 14.3 % (ref 11.5–15.5)
WBC: 9.4 10*3/uL (ref 4.0–10.5)
nRBC: 0 % (ref 0.0–0.2)

## 2022-10-05 LAB — RPR: RPR Ser Ql: NONREACTIVE

## 2022-10-05 MED ORDER — OXYTOCIN-SODIUM CHLORIDE 30-0.9 UT/500ML-% IV SOLN
2.5000 [IU]/h | INTRAVENOUS | Status: DC
  Filled 2022-10-05: qty 500

## 2022-10-05 MED ORDER — TERBUTALINE SULFATE 1 MG/ML IJ SOLN: 0.2500 mg | Freq: Once | INTRAMUSCULAR | Status: DC | PRN

## 2022-10-05 MED ORDER — LIDOCAINE-EPINEPHRINE (PF) 2 %-1:200000 IJ SOLN
INTRAMUSCULAR | Status: DC | PRN
  Administered 2022-10-05: 5 mL via EPIDURAL

## 2022-10-05 MED ORDER — OXYTOCIN-SODIUM CHLORIDE 30-0.9 UT/500ML-% IV SOLN
1.0000 m[IU]/min | INTRAVENOUS | Status: DC
  Administered 2022-10-05: 2 m[IU]/min via INTRAVENOUS

## 2022-10-05 MED ORDER — LACTATED RINGERS IV SOLN: 500.0000 mL | Freq: Once | INTRAVENOUS | Status: DC

## 2022-10-05 MED ORDER — FENTANYL-BUPIVACAINE-NACL 0.5-0.125-0.9 MG/250ML-% EP SOLN
12.0000 mL/h | EPIDURAL | Status: DC | PRN
  Administered 2022-10-05 (×2): 12 mL/h via EPIDURAL
  Filled 2022-10-05 (×2): qty 250

## 2022-10-05 MED ORDER — CEFAZOLIN SODIUM-DEXTROSE 1-4 GM/50ML-% IV SOLN
1.0000 g | Freq: Three times a day (TID) | INTRAVENOUS | Status: DC
  Administered 2022-10-05 (×2): 1 g via INTRAVENOUS
  Filled 2022-10-05 (×4): qty 50

## 2022-10-05 MED ORDER — OXYTOCIN BOLUS FROM INFUSION
333.0000 mL | Freq: Once | INTRAVENOUS | Status: AC
  Administered 2022-10-06: 333 mL via INTRAVENOUS

## 2022-10-05 MED ORDER — EPHEDRINE 5 MG/ML INJ: 10.0000 mg | INTRAVENOUS | Status: DC | PRN

## 2022-10-05 MED ORDER — LACTATED RINGERS IV SOLN: INTRAVENOUS | Status: DC

## 2022-10-05 MED ORDER — LACTATED RINGERS IV SOLN
500.0000 mL | INTRAVENOUS | Status: DC | PRN
  Administered 2022-10-05: 500 mL via INTRAVENOUS

## 2022-10-05 MED ORDER — DIPHENHYDRAMINE HCL 50 MG/ML IJ SOLN: 12.5000 mg | INTRAMUSCULAR | Status: DC | PRN

## 2022-10-05 MED ORDER — CEFAZOLIN SODIUM-DEXTROSE 2-4 GM/100ML-% IV SOLN
2.0000 g | Freq: Once | INTRAVENOUS | Status: AC
  Administered 2022-10-05: 2 g via INTRAVENOUS
  Filled 2022-10-05: qty 100

## 2022-10-05 MED ORDER — LIDOCAINE HCL (PF) 1 % IJ SOLN
30.0000 mL | INTRAMUSCULAR | Status: AC | PRN
  Administered 2022-10-06: 30 mL via SUBCUTANEOUS
  Filled 2022-10-05: qty 30

## 2022-10-05 MED ORDER — OXYCODONE-ACETAMINOPHEN 5-325 MG PO TABS: 2.0000 | ORAL_TABLET | ORAL | Status: DC | PRN

## 2022-10-05 MED ORDER — ACETAMINOPHEN 325 MG PO TABS: 650.0000 mg | ORAL_TABLET | ORAL | Status: DC | PRN

## 2022-10-05 MED ORDER — PHENYLEPHRINE 80 MCG/ML (10ML) SYRINGE FOR IV PUSH (FOR BLOOD PRESSURE SUPPORT): 80.0000 ug | PREFILLED_SYRINGE | INTRAVENOUS | Status: DC | PRN

## 2022-10-05 MED ORDER — SODIUM CHLORIDE 0.9 % IV SOLN
5.0000 10*6.[IU] | Freq: Once | INTRAVENOUS | Status: DC
  Filled 2022-10-05: qty 5

## 2022-10-05 MED ORDER — SOD CITRATE-CITRIC ACID 500-334 MG/5ML PO SOLN: 30.0000 mL | ORAL | Status: DC | PRN

## 2022-10-05 MED ORDER — OXYCODONE-ACETAMINOPHEN 5-325 MG PO TABS: 1.0000 | ORAL_TABLET | ORAL | Status: DC | PRN

## 2022-10-05 MED ORDER — NALBUPHINE HCL 10 MG/ML IJ SOLN
20.0000 mg | Freq: Once | INTRAMUSCULAR | Status: AC
  Administered 2022-10-05: 20 mg via INTRAMUSCULAR
  Filled 2022-10-05: qty 2

## 2022-10-05 MED ORDER — PENICILLIN G POT IN DEXTROSE 60000 UNIT/ML IV SOLN: 3.0000 10*6.[IU] | INTRAVENOUS | Status: DC

## 2022-10-05 MED ORDER — FLEET ENEMA 7-19 GM/118ML RE ENEM: 1.0000 | ENEMA | RECTAL | Status: DC | PRN

## 2022-10-05 MED ORDER — ONDANSETRON HCL 4 MG/2ML IJ SOLN: 4.0000 mg | Freq: Four times a day (QID) | INTRAMUSCULAR | Status: DC | PRN

## 2022-10-05 NOTE — MAU Note (Signed)
.  Lauren Wilkinson is a 22 y.o. at [redacted]w[redacted]d here in MAU reporting:   Around 1am pt states she felt like she had to use the bathroom. Ctxs every 2-4 min apart. No LOF, +FM.    Pain score: 9/10 lower abdominal pain contractions.    FHT:137 Lab orders placed from triage:   labor eval.

## 2022-10-05 NOTE — Anesthesia Procedure Notes (Signed)
Epidural Patient location during procedure: OB Start time: 10/05/2022 6:40 AM End time: 10/05/2022 6:50 AM  Staffing Anesthesiologist: Elmer Picker, MD Performed: anesthesiologist   Preanesthetic Checklist Completed: patient identified, IV checked, risks and benefits discussed, monitors and equipment checked, pre-op evaluation and timeout performed  Epidural Patient position: sitting Prep: DuraPrep and site prepped and draped Patient monitoring: continuous pulse ox, blood pressure, heart rate and cardiac monitor Approach: midline Location: L3-L4 Injection technique: LOR air  Needle:  Needle type: Tuohy  Needle gauge: 17 G Needle length: 9 cm Needle insertion depth: 6 cm Catheter type: closed end flexible Catheter size: 19 Gauge Catheter at skin depth: 11 cm Test dose: negative  Assessment Sensory level: T8 Events: blood not aspirated, no cerebrospinal fluid, injection not painful, no injection resistance, no paresthesia and negative IV test  Additional Notes Patient identified. Risks/Benefits/Options discussed with patient including but not limited to bleeding, infection, nerve damage, paralysis, failed block, incomplete pain control, headache, blood pressure changes, nausea, vomiting, reactions to medication both or allergic, itching and postpartum back pain. Confirmed with bedside nurse the patient's most recent platelet count. Confirmed with patient that they are not currently taking any anticoagulation, have any bleeding history or any family history of bleeding disorders. Patient expressed understanding and wished to proceed. All questions were answered. Sterile technique was used throughout the entire procedure. Please see nursing notes for vital signs. Test dose was given through epidural catheter and negative prior to continuing to dose epidural or start infusion. Warning signs of high block given to the patient including shortness of breath, tingling/numbness in hands,  complete motor block, or any concerning symptoms with instructions to call for help. Patient was given instructions on fall risk and not to get out of bed. All questions and concerns addressed with instructions to call with any issues or inadequate analgesia.  Reason for block:procedure for pain

## 2022-10-05 NOTE — Progress Notes (Signed)
Labor Progress Note  Lauren Wilkinson is a 22 y.o. G2P0010 at [redacted]w[redacted]d presented for spontaneous onset of labor  S: Patient doing well, has epidural.  Ready for AROM  O:  BP 121/63   Pulse (!) 107   Temp 98.2 F (36.8 C) (Oral)   Resp 18   Ht 5\' 4"  (1.626 m)   Wt 86.2 kg   LMP 12/27/2021 (Exact Date)   SpO2 100%   BMI 32.61 kg/m  EFM: 135 bpm/Moderate variability/ 15x15 accels/ None decels CAT: 1 Toco: regular, every 2-5 minutes   CVE: Dilation: 6.5 Effacement (%): 80 Cervical Position: Anterior Station: -1 Presentation: Vertex Exam by:: Dr. Lanae Crumbly   A&P: 22 y.o. G2P0010 [redacted]w[redacted]d  here for labor as above  #Labor: Progressing well.  AROM, clear #Pain: Family/Friend support and Epidural #FWB: CAT 1 #GBS positive-Ancef   Myrtie Hawk, DO FMOB Fellow, Faculty practice [facility], Center for [facility] 10/05/22  12:55 PM

## 2022-10-05 NOTE — Anesthesia Preprocedure Evaluation (Signed)
Anesthesia Evaluation  Patient identified by MRN, date of birth, ID band Patient awake    Reviewed: Allergy & Precautions, NPO status , Patient's Chart, lab work & pertinent test results  Airway Mallampati: II  TM Distance: >3 FB Neck ROM: Full    Dental no notable dental hx.    Pulmonary asthma    Pulmonary exam normal breath sounds clear to auscultation       Cardiovascular negative cardio ROS Normal cardiovascular exam Rhythm:Regular Rate:Normal     Neuro/Psych  PSYCHIATRIC DISORDERS Anxiety Depression    negative neurological ROS     GI/Hepatic negative GI ROS, Neg liver ROS,,,  Endo/Other  negative endocrine ROS    Renal/GU negative Renal ROS  negative genitourinary   Musculoskeletal negative musculoskeletal ROS (+)    Abdominal   Peds  Hematology negative hematology ROS (+)   Anesthesia Other Findings   Reproductive/Obstetrics (+) Pregnancy                             Anesthesia Physical Anesthesia Plan  ASA: 2  Anesthesia Plan: Epidural   Post-op Pain Management:    Induction:   PONV Risk Score and Plan: Treatment may vary due to age or medical condition  Airway Management Planned: Natural Airway  Additional Equipment:   Intra-op Plan:   Post-operative Plan:   Informed Consent: I have reviewed the patients History and Physical, chart, labs and discussed the procedure including the risks, benefits and alternatives for the proposed anesthesia with the patient or authorized representative who has indicated his/her understanding and acceptance.       Plan Discussed with: Anesthesiologist  Anesthesia Plan Comments: (Patient identified. Risks, benefits, options discussed with patient including but not limited to bleeding, infection, nerve damage, paralysis, failed block, incomplete pain control, headache, blood pressure changes, nausea, vomiting, reactions to  medication, itching, and post partum back pain. Confirmed with bedside nurse the patient's most recent platelet count. Confirmed with the patient that they are not taking any anticoagulation, have any bleeding history or any family history of bleeding disorders. Patient expressed understanding and wishes to proceed. All questions were answered. )       Anesthesia Quick Evaluation

## 2022-10-05 NOTE — Progress Notes (Signed)
Lauren Wilkinson is a 22 y.o. G2P0010 at [redacted]w[redacted]d admitted for spontaneous onset of labor  Subjective: Feeling intermittent pressure with contractions. Pain increased since the start of Pitocin. Per RN multiple maternal positions attempted. FOB and other family members supportive at bedside.  Objective: BP 124/77   Pulse 97   Temp 99.5 F (37.5 C) (Oral)   Resp 16   Ht 5\' 4"  (1.626 m)   Wt 86.2 kg   LMP 12/27/2021 (Exact Date)   SpO2 100%   BMI 32.61 kg/m  I/O last 3 completed shifts: In: -  Out: 1250 [Urine:1250] No intake/output data recorded.  FHT:  FHR: 130 bpm, variability: moderate,  accelerations:  Present,  decelerations:  Absent UC:   regular, every 2-6 minutes SVE:   Dilation: 8.5 Effacement (%): 80, 90 Station: 0 Exam by:: K Faucett RN Cervix swollen anteriorly per RN - ice applied in sterile glove to cervix  Labs: Lab Results  Component Value Date   WBC 9.4 10/05/2022   HGB 11.2 (L) 10/05/2022   HCT 34.4 (L) 10/05/2022   MCV 88.2 10/05/2022   PLT 243 10/05/2022    Assessment / Plan: Augmentation of labor, progressing well  Labor: Progressing on Pitocin, will continue to increase Preeclampsia:   n/a Fetal Wellbeing:  Category I Pain Control:  Epidural I/D:   GBS Pos Anticipated MOD:  NSVD  Lauren Wilkinson, CNM 10/05/2022, 8:55 PM

## 2022-10-05 NOTE — H&P (Signed)
OBSTETRIC ADMISSION HISTORY AND PHYSICAL  Lauren Wilkinson is a 22 y.o. female G2P0010 with IUP at [redacted]w[redacted]d by LMP presenting for spontaneous onset of labor. She reports +FMs, No LOF, no VB, no blurry vision, headaches or peripheral edema, and RUQ pain.  She plans on breast feeding. She is considering the depo shot for birth control. She received her prenatal care at [facility]   Dating: By LMP --->  Estimated Date of Delivery: 10/03/22  Sono:    @[redacted]w[redacted]d , CWD, normal anatomy, breech presentation, 291g, 70% EFW       Nursing Staff Provider  Office Location [facility] Dating  10/03/2022, by Last Menstrual Period/18  PNC Model [ ]  Traditional [x]  Centering [ ]  Mom-Baby Dyad Anatomy US  Nml  Language  English      Flu Vaccine  05/04/22 Genetic/Carrier Screen  NIPS:  LR female AFP: Negative Horizon: Negative x 4  TDaP Vaccine    Hgb A1C or  GTT Early 5.2 Third trimester   COVID Vaccine  No   LAB RESULTS   Rhogam   NA Blood Type B/Positive/-- (12/18 1103)   Baby Feeding Plan  Breastmilk Antibody Negative (12/18 1103)  Contraception Yes, IUD Rubella 9.45 (12/18 1103)  Circumcision Yes, Boy  RPR Non Reactive (12/18 1103)   Pediatrician    HBsAg Negative (12/18 1103)   Support Person  Undecided HCVAb Non Reactive (12/18 1103)   Prenatal Classes   HIV Non Reactive (12/18 1103)     BTL Consent   GBS  Pos urine (For PCN allergy, check sensitivities)   VBAC Consent NA Pap  Collect w/ GBS           DME Rx [ ]  BP cuff [ ]  Weight Scale Waterbirth  [ ]  Class [ ]  Consent [ ]  CNM visit  PHQ9 & GAD7 [  ] new OB [  ] 28 weeks  [X]  36 weeks Induction  [ ]  Orders Entered [ ] Foley Y/N     Prenatal History/Complications:   Patient Active Problem List   Diagnosis Date Noted   Normal labor 10/05/2022   Mixed anxiety depressive disorder 05/03/2022   Group B streptococcal bacteriuria 04/06/2022   Supervision of low-risk pregnancy 03/21/2022   Obesity in pregnancy 03/21/2022   PCOS (polycystic ovarian  syndrome) 08/23/2020   Scoliosis 04/26/2011   ASTHMA, UNSPECIFIED 06/06/2006    Past Medical History: Past Medical History:  Diagnosis Date   Anxiety    Asthma    as child   Back pain 04/13/2013   Depression    Midline thoracic back pain 12/17/2013   Scoliosis    UTI (urinary tract infection)     Past Surgical History: Past Surgical History:  Procedure Laterality Date   NO PAST SURGERIES      Obstetrical History: OB History     Gravida  2   Para      Term      Preterm      AB  1   Living  0      SAB      IAB      Ectopic  1   Multiple      Live Births  0           Social History Social History   Socioeconomic History   Marital status: Significant Other    Spouse name: Not on file   Number of children: Not on file   Years of education: Not on file   Highest  education level: Not on file  Occupational History   Not on file  Tobacco Use   Smoking status: Never   Smokeless tobacco: Never   Tobacco comments:    Sometimes vape  Vaping Use   Vaping Use: Former   Quit date: 01/24/2022   Substances: Nicotine  Substance and Sexual Activity   Alcohol use: No   Drug use: No   Sexual activity: Yes    Birth control/protection: None  Other Topics Concern   Not on file  Social History Narrative   Not on file   Social Determinants of Health   Financial Resource Strain: Not on file  Food Insecurity: No Food Insecurity (09/20/2022)   Hunger Vital Sign    Worried About Running Out of Food in the Last Year: Never true    Ran Out of Food in the Last Year: Never true  Transportation Needs: No Transportation Needs (09/20/2022)   PRAPARE - Administrator, Civil Service (Medical): No    Lack of Transportation (Non-Medical): No  Physical Activity: Not on file  Stress: Not on file  Social Connections: Not on file    Family History: Family History  Problem Relation Age of Onset   Asthma Mother    Hypertension Mother    Clotting  disorder Mother    Diabetes Sister    Autism Brother     Allergies: No Known Allergies  Medications Prior to Admission  Medication Sig Dispense Refill Last Dose   Prenatal Vit-Fe Fumarate-FA (PRENATAL VITAMIN) 27-0.8 MG TABS Take 1 tablet by mouth daily. 30 tablet 11 Past Week   acetaminophen (TYLENOL) 325 MG tablet Take by mouth.      aspirin EC 81 MG tablet Take 1 tablet (81 mg total) by mouth daily. Take after 12 weeks for prevention of preeclampssia later in pregnancy 300 tablet 2    Cholecalciferol (VITAMIN D) 125 MCG (5000 UT) CAPS Take 1 capsule by mouth daily after breakfast. (Patient not taking: Reported on 05/09/2022) 30 capsule 11    clindamycin (CLEOCIN) 2 % vaginal cream Place 1 Applicatorful vaginally at bedtime. (Patient not taking: Reported on 08/10/2022) 40 g 0    escitalopram (LEXAPRO) 20 MG tablet Take 20 mg by mouth daily. (Patient not taking: Reported on 05/09/2022)      ibuprofen (ADVIL) 200 MG tablet Take by mouth. (Patient not taking: Reported on 09/10/2022)      montelukast (SINGULAIR) 10 MG tablet Take by mouth. (Patient not taking: Reported on 09/10/2022)        Review of Systems   All systems reviewed and negative except as stated in HPI  Blood pressure 133/89, pulse 99, temperature 98.6 F (37 C), temperature source Oral, resp. rate 20, height 5\' 4"  (1.626 m), weight 86.2 kg, last menstrual period 12/27/2021, SpO2 100 %, unknown if currently breastfeeding.  General appearance: alert, cooperative, and appears stated age Lungs: clear to auscultation bilaterally Heart: regular rate and rhythm Abdomen: soft, non-tender; bowel sounds normal Pelvic: see below Extremities: Homans sign is negative, no sign of DVT  Presentation: cephalic  Fetal monitoring: 125 bpm, moderate variability, + accelerations, no decelerations  Uterine activity:  contractions every 2-3 mins  Dilation: 5.5 Effacement (%): 90 Station: -1 Exam by:: Felipa Furnace RN   Prenatal  labs: ABO, Rh: --/--/PENDING (06/28 0545) Antibody: PENDING (06/28 0545) Rubella: 9.45 (12/18 1103) RPR: Non Reactive (05/17 0930)  HBsAg: Negative (12/18 1103)  HIV: Non Reactive (05/17 0930)  GBS:    2 hr Glucola  normal Genetic screening  low risk Anatomy US normal  Prenatal Transfer Tool  Maternal Diabetes: No Genetic Screening: Normal Maternal Ultrasounds/Referrals: Normal Fetal Ultrasounds or other Referrals:  None Maternal Substance Abuse:  No Significant Maternal Medications:  None Significant Maternal Lab Results:  Group B Strep positive Number of Prenatal Visits:greater than 3 verified prenatal visits Other Comments:  None  Results for orders placed or performed during the hospital encounter of 10/05/22 (from the past 24 hour(s))  CBC   Collection Time: 10/05/22  5:45 AM  Result Value Ref Range   WBC 9.4 4.0 - 10.5 K/uL   RBC 3.90 3.87 - 5.11 MIL/uL   Hemoglobin 11.2 (L) 12.0 - 15.0 g/dL   HCT 57.8 (L) 46.9 - 62.9 %   MCV 88.2 80.0 - 100.0 fL   MCH 28.7 26.0 - 34.0 pg   MCHC 32.6 30.0 - 36.0 g/dL   RDW 52.8 41.3 - 24.4 %   Platelets 243 150 - 400 K/uL   nRBC 0.0 0.0 - 0.2 %  Type and screen [facility]   Collection Time: 10/05/22  5:45 AM  Result Value Ref Range   ABO/RH(D) PENDING    Antibody Screen PENDING    Sample Expiration      10/08/2022,2359 Performed at [facility], [address] [place], [place] [address]     Assessment/Plan:  Lauren Wilkinson is a 22 y.o. G2P0010 at [redacted]w[redacted]d here for spontaneous onset of labor.   #Labor:Admit to L & D for labor management. Will like to avoid oxytocin if possible, but open to AROM. #Pain: Will get Epidural on admission #FWB: Cat 1 #ID:  GBS positive - PCN for ppx #MOF: breast  #MOC: considering the depo shot. #Circ:  yes  Sheppard Evens MD MPH OB Fellow, Faculty Practice [facility], Center for [facility] 10/05/2022

## 2022-10-05 NOTE — Progress Notes (Signed)
Labor Progress Note  Lauren Wilkinson is a 22 y.o. G2P0010 at [redacted]w[redacted]d presented for spontaneous onset of labor  S: pt feeling intermittent pressure  O:  BP 120/82   Pulse (!) 103   Temp 98.1 F (36.7 C) (Oral)   Resp 18   Ht 5\' 4"  (1.626 m)   Wt 86.2 kg   LMP 12/27/2021 (Exact Date)   SpO2 100%   BMI 32.61 kg/m  EFM: 135 bpm/Moderate variability/ 15x15 accels/ None decels CAT: 1 Toco: regular, every 2-5 minutes   CVE: 8.5/80/0   A&P: 22 y.o. G2P0010 [redacted]w[redacted]d  here for labor as above  #Labor: Progressing well.  Continue expectant management. Anticipate SVD #Pain: Family/Friend support and Epidural #FWB: CAT 1 #GBS positive-Ancef   Myrtie Hawk, DO FMOB Fellow, Faculty practice [facility], Center for [facility] 10/05/22  5:38 PM

## 2022-10-06 ENCOUNTER — Encounter (HOSPITAL_COMMUNITY): Payer: Self-pay | Admitting: Obstetrics and Gynecology

## 2022-10-06 DIAGNOSIS — O99344 Other mental disorders complicating childbirth: Secondary | ICD-10-CM | POA: Diagnosis not present

## 2022-10-06 DIAGNOSIS — O99820 Streptococcus B carrier state complicating pregnancy: Secondary | ICD-10-CM | POA: Diagnosis not present

## 2022-10-06 DIAGNOSIS — O4202 Full-term premature rupture of membranes, onset of labor within 24 hours of rupture: Secondary | ICD-10-CM | POA: Diagnosis not present

## 2022-10-06 DIAGNOSIS — O480 Post-term pregnancy: Secondary | ICD-10-CM | POA: Diagnosis not present

## 2022-10-06 DIAGNOSIS — Z3A40 40 weeks gestation of pregnancy: Secondary | ICD-10-CM

## 2022-10-06 DIAGNOSIS — O99214 Obesity complicating childbirth: Secondary | ICD-10-CM

## 2022-10-06 MED ORDER — ONDANSETRON HCL 4 MG PO TABS: 4.0000 mg | ORAL_TABLET | ORAL | Status: DC | PRN

## 2022-10-06 MED ORDER — CLINDAMYCIN PHOSPHATE 900 MG/50ML IV SOLN
900.0000 mg | Freq: Three times a day (TID) | INTRAVENOUS | Status: DC
  Administered 2022-10-06 (×2): 900 mg via INTRAVENOUS
  Filled 2022-10-06 (×3): qty 50

## 2022-10-06 MED ORDER — COCONUT OIL OIL: 1.0000 | TOPICAL_OIL | Status: DC | PRN

## 2022-10-06 MED ORDER — PRENATAL MULTIVITAMIN CH
1.0000 | ORAL_TABLET | Freq: Every day | ORAL | Status: DC
  Administered 2022-10-06 – 2022-10-08 (×3): 1 via ORAL
  Filled 2022-10-06 (×3): qty 1

## 2022-10-06 MED ORDER — ZOLPIDEM TARTRATE 5 MG PO TABS: 5.0000 mg | ORAL_TABLET | Freq: Every evening | ORAL | Status: DC | PRN

## 2022-10-06 MED ORDER — IBUPROFEN 600 MG PO TABS
600.0000 mg | ORAL_TABLET | Freq: Four times a day (QID) | ORAL | Status: DC
  Administered 2022-10-06 – 2022-10-08 (×10): 600 mg via ORAL
  Filled 2022-10-06 (×10): qty 1

## 2022-10-06 MED ORDER — GENTAMICIN SULFATE 40 MG/ML IJ SOLN
5.0000 mg/kg | INTRAVENOUS | Status: AC
  Administered 2022-10-06: 340 mg via INTRAVENOUS
  Filled 2022-10-06: qty 8.5

## 2022-10-06 MED ORDER — SENNOSIDES-DOCUSATE SODIUM 8.6-50 MG PO TABS
2.0000 | ORAL_TABLET | Freq: Every day | ORAL | Status: DC
  Administered 2022-10-08: 2 via ORAL
  Filled 2022-10-06 (×2): qty 2

## 2022-10-06 MED ORDER — WITCH HAZEL-GLYCERIN EX PADS: 1.0000 | MEDICATED_PAD | CUTANEOUS | Status: DC | PRN

## 2022-10-06 MED ORDER — BENZOCAINE-MENTHOL 20-0.5 % EX AERO
1.0000 | INHALATION_SPRAY | CUTANEOUS | Status: DC | PRN
  Administered 2022-10-06: 1 via TOPICAL
  Filled 2022-10-06: qty 56

## 2022-10-06 MED ORDER — ONDANSETRON HCL 4 MG/2ML IJ SOLN: 4.0000 mg | INTRAMUSCULAR | Status: DC | PRN

## 2022-10-06 MED ORDER — SIMETHICONE 80 MG PO CHEW: 80.0000 mg | CHEWABLE_TABLET | ORAL | Status: DC | PRN

## 2022-10-06 MED ORDER — DIBUCAINE (PERIANAL) 1 % EX OINT: 1.0000 | TOPICAL_OINTMENT | CUTANEOUS | Status: DC | PRN

## 2022-10-06 MED ORDER — TETANUS-DIPHTH-ACELL PERTUSSIS 5-2.5-18.5 LF-MCG/0.5 IM SUSY: 0.5000 mL | PREFILLED_SYRINGE | Freq: Once | INTRAMUSCULAR | Status: DC

## 2022-10-06 MED ORDER — ACETAMINOPHEN 325 MG PO TABS: 650.0000 mg | ORAL_TABLET | ORAL | Status: DC | PRN

## 2022-10-06 MED ORDER — DIPHENHYDRAMINE HCL 25 MG PO CAPS: 25.0000 mg | ORAL_CAPSULE | Freq: Four times a day (QID) | ORAL | Status: DC | PRN

## 2022-10-06 NOTE — Anesthesia Postprocedure Evaluation (Signed)
Anesthesia Post Note  Patient: Lauren Wilkinson  Procedure(s) Performed: AN AD HOC LABOR EPIDURAL     Patient location during evaluation: Mother Baby Anesthesia Type: Epidural Level of consciousness: awake Pain management: satisfactory to patient Vital Signs Assessment: post-procedure vital signs reviewed and stable Respiratory status: spontaneous breathing Cardiovascular status: stable Anesthetic complications: no  No notable events documented.  Last Vitals:  Vitals:   10/06/22 0409 10/06/22 0455  BP: 118/79 115/79  Pulse: (!) 110 (!) 106  Resp: 16 16  Temp: 36.8 C 36.8 C  SpO2: 98% 100%    Last Pain:  Vitals:   10/06/22 0455  TempSrc: Oral  PainSc: 5    Pain Goal:                   [employer]

## 2022-10-06 NOTE — Progress Notes (Signed)
Pharmacy Antibiotic Note  Delila Doster is a 22 y.o. female admitted on 10/05/2022 with SOL. Patient delivered baby and placenta upon inspection with increased calcifications and hot to touch d/t suspected chorioamnionitis.  Pharmacy has been consulted for gentamicin dosing for suspected chorio.   Plan: Gentamicin 5 mg/kg Q24   Height: 5\' 4"  (162.6 cm) Weight: 86.2 kg (190 lb) IBW/kg (Calculated) : 54.7  Temp (24hrs), Avg:98.6 F (37 C), Min:97.8 F (36.6 C), Max:100.1 F (37.8 C)  Recent Labs  Lab 10/05/22 0545  WBC 9.4    CrCl cannot be calculated (Patient's most recent lab result is older than the maximum 21 days allowed.).    Allergies  Allergen Reactions   Amoxicillin Hives    Antimicrobials this admission: cefazolin 6/28 >> 6/29 gentamicin 6/29 >>    Microbiology results: GBS positive in urine- cefazolin in labor  Thank you for allowing pharmacy to be a part of this patient's care.  Loyola Mast 10/06/2022 4:35 AM

## 2022-10-06 NOTE — Discharge Summary (Signed)
Postpartum Discharge Summary  Date of Service updated***     Patient Name: Lauren Wilkinson DOB: [birth year] MRN: [id number]  Date of admission: 10/05/2022 Delivery date:10/06/2022  Delivering provider: [name]  Date of discharge: 10/06/2022  Admitting diagnosis: Normal labor [O80, Z37.9] Intrauterine pregnancy: [redacted]w[redacted]d     Secondary diagnosis:  Principal Problem:   Normal labor Active Problems:   Supervision of low-risk pregnancy   Group B streptococcal bacteriuria  Additional problems: Suspected Chorioamnionitis    Discharge diagnosis: Term Pregnancy Delivered                                              Post partum procedures:{Postpartum procedures:23558} Augmentation: AROM and Pitocin Complications: None  Hospital course: Onset of Labor With Vaginal Delivery      22 y.o. yo G2P1011 at [redacted]w[redacted]d was admitted in Latent Labor on 10/05/2022. Labor course was complicated by chorioamnionitis  Membrane Rupture Time/Date: 11:08 AM ,10/05/2022   Delivery Method:Vaginal, Spontaneous  Episiotomy: None  Lacerations:  1st degree;Vaginal  Patient had a postpartum course complicated by ***.  She is ambulating, tolerating a regular diet, passing flatus, and urinating well. Patient is discharged home in stable condition on 10/06/22.  Newborn Data: Birth date:10/06/2022  Birth time:1:51 AM  Gender:Female  Living status:Living  Apgars:8 ,9  Weight:3755 g   Magnesium Sulfate received: No BMZ received: No Rhophylac:N/A MMR:No - immune T-DaP:{Tdap:23962} Flu: Yes - 05/04/2022 Transfusion:{Transfusion received:30440034}  Physical exam  Vitals:   10/06/22 0246 10/06/22 0301 10/06/22 0316 10/06/22 0409  BP: 131/85 122/84 122/84 118/79  Pulse: (!) 120 (!) 124 (!) 112 (!) 110  Resp:    16  Temp:    98.3 F (36.8 C)  TempSrc:    Oral  SpO2:    98%  Weight:      Height:       General: {Exam; general:21111117} Lochia: {Desc; appropriate/inappropriate:30686::"appropriate"} Uterine  Fundus: {Desc; firm/soft:30687} Incision: {Exam; incision:21111123} DVT Evaluation: {Exam; dvt:2111122} Labs: Lab Results  Component Value Date   WBC 9.4 10/05/2022   HGB 11.2 (L) 10/05/2022   HCT 34.4 (L) 10/05/2022   MCV 88.2 10/05/2022   PLT 243 10/05/2022      Latest Ref Rng & Units 03/03/2022    8:42 PM  CMP  Glucose 70 - 99 mg/dL 78   BUN 6 - 20 mg/dL <5   Creatinine 9.52 - 1.00 mg/dL 8.41   Sodium 324 - 401 mmol/L 134   Potassium 3.5 - 5.1 mmol/L 3.3   Chloride 98 - 111 mmol/L 100   CO2 22 - 32 mmol/L 22   Calcium 8.9 - 10.3 mg/dL 9.5   Total Protein 6.5 - 8.1 g/dL 7.4   Total Bilirubin 0.3 - 1.2 mg/dL 0.4   Alkaline Phos 38 - 126 U/L 49   AST 15 - 41 U/L 18   ALT 0 - 44 U/L 17    Edinburgh Score:     No data to display           After visit meds:  Allergies as of 10/06/2022       Reactions   Amoxicillin Hives     Med Rec must be completed prior to using this [facility]***       Discharge home in stable condition Infant Feeding: Bottle and Breast Infant Disposition:{CHL IP OB HOME WITH UUVOZD:66440} Discharge instruction: per After  Visit Summary and Postpartum booklet. Activity: Advance as tolerated. Pelvic rest for 6 weeks.  Diet: routine diet Future Appointments: Future Appointments  Date Time Provider Department Center  10/10/2022 10:15 AM [facility]   Follow up Visit:  Message sent to [facility] by R. Arita Miss, CNM on 10/06/2022 Please schedule this patient for a In person postpartum visit in 6 weeks with the following provider: Any provider. Additional Postpartum F/U: none   Low risk pregnancy complicated by:  none Delivery mode:  Vaginal, Spontaneous  Anticipated Birth Control:  PP Depo given   10/06/2022 [name], CNM

## 2022-10-06 NOTE — Progress Notes (Signed)
Janajah Dahlem is a 22 y.o. G2P0010 at [redacted]w[redacted]d admitted for spontaneous onset of labor  Subjective: Patient requesting to be checked, because she "hasn't been checked since Pitocin has been started at 2000."  Objective: BP 112/72   Pulse (!) 102   Temp 99.2 F (37.3 C) (Oral)   Resp 17   Ht 5\' 4"  (1.626 m)   Wt 86.2 kg   LMP 12/27/2021 (Exact Date)   SpO2 100%   BMI 32.61 kg/m  I/O last 3 completed shifts: In: -  Out: 1250 [Urine:1250] No intake/output data recorded.  FHT:  FHR: 135 bpm, variability: moderate,  accelerations:  Present,  decelerations:  Absent UC:   regular, every 1.5-5 minutes SVE:   Dilation: Lip/rim Effacement (%): 100 Station: Plus 1 (minimal caput)   Exam by:: Carloyn Jaeger, CNM   Labs: Lab Results  Component Value Date   WBC 9.4 10/05/2022   HGB 11.2 (L) 10/05/2022   HCT 34.4 (L) 10/05/2022   MCV 88.2 10/05/2022   PLT 243 10/05/2022    Assessment / Plan: Augmentation of labor, progressing well  Labor: Progressing on Pitocin  Apply Peanut Ball in RT lateral position Preeclampsia:   n/a Fetal Wellbeing:  Category I Pain Control:  Epidural I/D:   GBS Pos Anticipated MOD:  NSVD  [name], CNM 10/06/2022, 12:33 AM

## 2022-10-06 NOTE — Lactation Note (Signed)
This note was copied from a baby's chart. Lactation Consultation Note  Patient Name: Lauren Wilkinson [id number] Date: 10/06/2022 Age:22 hours Reason for consult: Initial assessment;Primapara;1st time breastfeeding;Term;Breastfeeding assistance The infant was at 30 hours old.  LC entered the room and the infant had just had an episode of emesis.  The birth parent stated that she had a fast delivery and was warned that it could happen.  RN and LC reassured the birth parent.  The birth parent stated that the infant latches well.  LC did not see a latch.  LC let the birth parent know that the infant may or may not be interested in feeding.  LC encouraged the birth parent to hand express if the infant did not latch and spoon feed.  LC showed the birth parent how to hand express and gave her a manual pump per her request.  LC demonstrated how to assemble, disassemble, wash and dry pump parts, and spoke with the birth parent about milk production.  LC also spoke with the birth parent about milk production, cluster feeding, and infant behavior.  All questions were answered.   Infant Feeding Plan:  Breastfeed 8+ times in 24 hours according to feeding cues.  Hand express if the infant does not latch and feed the expressed milk to the infant via a spoon.  Call RN/LC for assistance with breastfeeding.    Maternal Data Has patient been taught Hand Expression?: Yes Does the patient have breastfeeding experience prior to this delivery?: No  Feeding Mother's Current Feeding Choice: Breast Milk  Lactation Tools Discussed/Used Tools: Flanges;Pump Flange Size: 24 Breast pump type: Manual Pump Education: Setup, frequency, and cleaning;Milk Storage Reason for Pumping: Birth parent's request Pumping frequency: PRN  Interventions Interventions: Breast feeding basics reviewed;Education;Hand pump;LC Services brochure  Discharge Pump: Personal;DEBP  Consult Status Consult Status: Follow-up Date:  10/07/22 Follow-up type: In-patient   Delene Loll 10/06/2022, 10:57 AM

## 2022-10-07 ENCOUNTER — Encounter: Payer: Self-pay | Admitting: Certified Nurse Midwife

## 2022-10-07 NOTE — Progress Notes (Signed)
POSTPARTUM PROGRESS NOTE  Post Partum Day 1  Subjective:  Lauren Wilkinson is a 22 y.o. G2P1011 s/p SVD at [redacted]w[redacted]d.  She reports she is doing well. No acute events overnight. She denies any problems with ambulating, voiding or po intake. Denies nausea or vomiting.  Pain is well controlled.  Lochia is appropriate .  Objective: Blood pressure 115/82, pulse 99, temperature 97.9 F (36.6 C), temperature source Oral, resp. rate 16, height 5\' 4"  (1.626 m), weight 86.2 kg, last menstrual period 12/27/2021, SpO2 99 %, unknown if currently breastfeeding.  Physical Exam:  General: alert, cooperative and no distress Chest: no respiratory distress Heart:regular rate, distal pulses intact Abdomen: soft, nontender,  Uterine Fundus: firm, appropriately tender DVT Evaluation: No calf swelling or tenderness Extremities: no edema Skin: warm, dry  Recent Labs    10/05/22 0545  HGB 11.2*  HCT 34.4*    Assessment/Plan: Lauren Wilkinson is a 22 y.o. G2P1011 s/p SVD at [redacted]w[redacted]d   PPD#1 - Doing well  Routine postpartum care  Suspected chorio: continue antibiotics per pharmacy  Contraception: considering depo  Feeding: breast  Dispo: Plan for discharge tomorrow.   LOS: 2 days   Derrel Nip, MD  OB Fellow  10/07/2022, 9:15 AM

## 2022-10-07 NOTE — Lactation Note (Signed)
This note was copied from a baby's chart. Lactation Consultation Note  Patient Name: Boy Deasya Heis [id number] Date: 10/07/2022 Age:22 hours Reason for consult: Follow-up assessment;Primapara;1st time breastfeeding;Infant weight loss;Term;Breastfeeding assistance (3.73% WL)  The infant was at 53 hours old.  The birth parent said that the infant was doing better at the breast.  She stated that the infant has been breastfeeding and being supplemented with formula.  She remarked that the latch is more comfortable but the infant is still chewing on her nipple some.  She has been working on getting a deeper latch.  LC encouraged the birth parent to continue working on the latch.  LC also spoke with the birth parent about pumping frequency.  She stated that she has been latching the infant for 10-15 min.  The DEBP was set up by the RN.  LC encouraged her to offer both breasts at every feeding and pump after every other feeding.  If the infant does not latch then the birth parent should pump.  LC reviewed milk storage guidelines and community resources.  All questions were answered.   Feeding Mother's Current Feeding Choice: Breast Milk and Formula Nipple Type: Slow - flow  Interventions Interventions: Education  Consult Status Consult Status: Follow-up Date: 10/08/22 Follow-up type: In-patient   Delene Loll 10/07/2022, 4:24 PM

## 2022-10-07 NOTE — Progress Notes (Signed)
CSW received consult for hx of Anxiety and Depression and Edinburgh Postnatal Depression scale score of 11. CSW met with Lauren Wilkinson to offer support and complete assessment. When CSW entered room, Lauren Wilkinson was observed holding and bonding with infant skin-to-skin. FOB was present laying on couch. Lauren Wilkinson provided verbal consent to speak in front of FOB about anything. CSW introduced self and reason for consult. Lauren Wilkinson presented as calm and remained engaged throughout consult.  CSW inquired how Lauren Wilkinson is feeling emotionally since infant's arrival. Lauren Wilkinson shares she is feeling "fine so far". Lauren Wilkinson shared that she has felt a little bit anxious since infant's arrival which she attributed to being a first time mom. Lauren Wilkinson states that her feelings of anxiety have made it "a little hard" to sleep. CSW inquired about Lauren Wilkinson's mental health history. Lauren Wilkinson reports she was diagnosed with anxiety and depression in 2020. Lauren Wilkinson reports she was prescribed Lexapro in 2020 and was also prescribed valium and Buspar around this time time. Lauren Wilkinson states that she stopped taking medications around 2021 due to her symptoms improving. Lauren Wilkinson states she is not currently taking psychotropic medication and is not current with a therapist. Lauren Wilkinson was agreeable to outpatient mental health resources, which CSW provided. Lauren Wilkinson identified her mother, infant's godmothers, FOB and FOB's mom as supports. Lauren Wilkinson shares that she also had a doula for infant's birth and her cousin is a Advertising copywriter. Lauren Wilkinson denied current SI/HI.   CSW inquired about mental health symptoms during pregnancy. Lauren Wilkinson states that she felt she was "balanced out" during her pregnancy but did note feelings of irritability that she believes were due to hormonal changes.   CSW provided education regarding the baby blues period vs. perinatal mood disorders, discussed treatment and gave resources for mental health follow up if concerns arise.  CSW recommends self-evaluation during the postpartum time period using the New Mom  Checklist from Postpartum Progress and encouraged Lauren Wilkinson to contact a medical professional if symptoms are noted at any time.    Lauren Wilkinson reports she has all needed items for infant, including a car seat and bassinet. Lauren Wilkinson has chosen [facility] for infant's follow up care.  CSW provided review of Sudden Infant Death Syndrome (SIDS) precautions.    CSW identifies no further need for intervention and no barriers to discharge at this time.  Signed,  Norberto Sorenson, MSW, LCSWA, LCASA 10/07/2022 3:18 PM

## 2022-10-08 ENCOUNTER — Other Ambulatory Visit (HOSPITAL_COMMUNITY): Payer: Self-pay

## 2022-10-08 ENCOUNTER — Telehealth: Payer: Self-pay | Admitting: Clinical

## 2022-10-08 MED ORDER — BENZOCAINE-MENTHOL 20-0.5 % EX AERO
1.0000 | INHALATION_SPRAY | CUTANEOUS | 0 refills | Status: AC | PRN
  Filled 2022-10-08: qty 78, fill #0

## 2022-10-08 MED ORDER — MEDROXYPROGESTERONE ACETATE 150 MG/ML IM SUSP
150.0000 mg | Freq: Once | INTRAMUSCULAR | Status: AC
  Administered 2022-10-08: 150 mg via INTRAMUSCULAR
  Filled 2022-10-08 (×2): qty 1

## 2022-10-08 MED ORDER — ACETAMINOPHEN 325 MG PO TABS
650.0000 mg | ORAL_TABLET | ORAL | 0 refills | Status: AC | PRN
  Filled 2022-10-08: qty 100, 9d supply, fill #0

## 2022-10-08 MED ORDER — IBUPROFEN 600 MG PO TABS
600.0000 mg | ORAL_TABLET | Freq: Four times a day (QID) | ORAL | 0 refills | Status: AC
  Filled 2022-10-08: qty 30, 8d supply, fill #0

## 2022-10-08 MED ORDER — SENNOSIDES-DOCUSATE SODIUM 8.6-50 MG PO TABS
2.0000 | ORAL_TABLET | Freq: Every day | ORAL | 0 refills | Status: AC
  Filled 2022-10-08: qty 30, 15d supply, fill #0

## 2022-10-08 NOTE — Telephone Encounter (Signed)
Attempt call regarding verbal referral; Unable to leave message as call cannot be completed at this time; left MyChart message for pt.

## 2022-10-09 ENCOUNTER — Telehealth (HOSPITAL_COMMUNITY): Payer: Self-pay

## 2022-10-09 ENCOUNTER — Encounter: Payer: Self-pay | Admitting: General Practice

## 2022-10-09 DIAGNOSIS — Z1331 Encounter for screening for depression: Secondary | ICD-10-CM

## 2022-10-09 NOTE — Telephone Encounter (Signed)
Hospital inpatient EPDS = 11. CSW consult completed during hospital stay. Ambulatory referral to Integrated Behavioral Health placed now. Notified Dr. Adrian Blackwater via chart or IBH referral.   Signe Colt 10/09/22,1104

## 2022-10-10 ENCOUNTER — Other Ambulatory Visit: Payer: Self-pay

## 2022-10-10 ENCOUNTER — Other Ambulatory Visit: Payer: Medicaid Other

## 2022-10-10 DIAGNOSIS — Z3493 Encounter for supervision of normal pregnancy, unspecified, third trimester: Secondary | ICD-10-CM

## 2022-10-12 ENCOUNTER — Inpatient Hospital Stay (HOSPITAL_COMMUNITY): Admission: RE | Admit: 2022-10-12 | Payer: Medicaid Other | Source: Ambulatory Visit

## 2022-11-03 ENCOUNTER — Telehealth (HOSPITAL_COMMUNITY): Payer: Self-pay

## 2022-11-03 NOTE — Telephone Encounter (Signed)
11/03/2022 1346  Name: Lauren Wilkinson MRN: [id number] DOB: 2001-03-08  Reason for Call:  Transition of Care Hospital Discharge Call  Contact Status: Patient Contact Status: Complete  Language assistant needed: Interpreter Mode: Interpreter Not Needed        Follow-Up Questions: Do You Have Any Concerns About Your Health As You Heal From Delivery?: No Do You Have Any Concerns About Your Infants Health?: No Patient verbalizes that herself and baby are both doing good. Baby is eating and gaining weight well per patient.   Edinburgh Postnatal Depression Scale:  In the Past 7 Days: I have been able to laugh and see the funny side of things.: As much as I always could I have looked forward with enjoyment to things.: Rather less than I used to I have blamed myself unnecessarily when things went wrong.: Yes, some of the time I have been anxious or worried for no good reason.: Yes, sometimes I have felt scared or panicky for no good reason.: Yes, sometimes Things have been getting on top of me.: No, most of the time I have coped quite well I have been so unhappy that I have had difficulty sleeping.: Yes, sometimes I have felt sad or miserable.: Yes, quite often I have been so unhappy that I have been crying.: Only occasionally The thought of harming myself has occurred to me.: Never Edinburgh Postnatal Depression Scale Total: (!) 13    PHQ2-9 Depression Scale:     Discharge Follow-up: Edinburgh score requires follow up?: Yes Provider notified of Edinburgh score?:  (Patient has [facility] referral in place already.) Patient was advised of the following resources:: Support Group, Breastfeeding Support Group Patient referred to::  ([facility] referral in place.) Did patient express any COVID concerns?: No    Post-discharge interventions: Reviewed Newborn Safe Sleep Practices  EPDS today =13. [facility] referral already in place. RN will reach out to [facility] social worker to inform her that patient  would like to set up an appointment for counseling.   RN told patient about Maternal Mental Health Resources ([facility], National Maternal Mental Health Hotline, [employer]). Also told patient about [facility] support group offerings. Will e-mail these resources to patient as well.  Signature  Signe Colt

## 2022-11-06 ENCOUNTER — Telehealth: Payer: Self-pay | Admitting: Clinical

## 2022-11-06 NOTE — Telephone Encounter (Signed)
Second attempt call regarding referral; Unable to leave a voicemail.

## 2022-12-14 ENCOUNTER — Ambulatory Visit: Payer: Medicaid Other | Admitting: Advanced Practice Midwife
